# Patient Record
Sex: Female | Born: 1980
Health system: Southern US, Community
[De-identification: ages and names within clinical notes are randomized; demographics above are authoritative.]

## PROBLEM LIST (undated history)

## (undated) ENCOUNTER — Inpatient Hospital Stay (HOSPITAL_COMMUNITY): Payer: Self-pay

## (undated) DIAGNOSIS — E282 Polycystic ovarian syndrome: Secondary | ICD-10-CM

## (undated) DIAGNOSIS — I10 Essential (primary) hypertension: Secondary | ICD-10-CM

## (undated) DIAGNOSIS — D649 Anemia, unspecified: Secondary | ICD-10-CM

## (undated) HISTORY — DX: Polycystic ovarian syndrome: E28.2

## (undated) HISTORY — DX: Essential (primary) hypertension: I10

---

## 2005-02-04 ENCOUNTER — Other Ambulatory Visit: Admission: RE | Admit: 2005-02-04 | Discharge: 2005-02-04 | Payer: Self-pay | Admitting: Family Medicine

## 2005-02-04 ENCOUNTER — Ambulatory Visit: Payer: Self-pay | Admitting: Family Medicine

## 2006-08-04 ENCOUNTER — Other Ambulatory Visit: Admission: RE | Admit: 2006-08-04 | Discharge: 2006-08-04 | Payer: Self-pay | Admitting: Family Medicine

## 2006-08-04 ENCOUNTER — Ambulatory Visit: Payer: Self-pay | Admitting: Family Medicine

## 2006-08-04 LAB — CONVERTED CEMR LAB
AST: 24 units/L (ref 0–37)
Albumin: 4.3 g/dL (ref 3.5–5.2)
Basophils Absolute: 0 10*3/uL (ref 0.0–0.1)
CO2: 25 meq/L (ref 19–32)
Chloride: 107 meq/L (ref 96–112)
Chol/HDL Ratio, serum: 2.5
Cholesterol: 169 mg/dL (ref 0–200)
Creatinine, Ser: 0.9 mg/dL (ref 0.4–1.2)
Glomerular Filtration Rate, Af Am: 99 mL/min/{1.73_m2}
Glucose, Bld: 82 mg/dL (ref 70–99)
HCT: 36.5 % (ref 36.0–46.0)
Lymphocytes Relative: 29.2 % (ref 12.0–46.0)
MCHC: 32.9 g/dL (ref 30.0–36.0)
MCV: 84.8 fL (ref 78.0–100.0)
Neutro Abs: 2.8 10*3/uL (ref 1.4–7.7)
Neutrophils Relative %: 61.4 % (ref 43.0–77.0)
RBC: 4.31 M/uL (ref 3.87–5.11)
Sodium: 139 meq/L (ref 135–145)
Total Bilirubin: 0.8 mg/dL (ref 0.3–1.2)
Triglyceride fasting, serum: 36 mg/dL (ref 0–149)

## 2006-08-25 ENCOUNTER — Ambulatory Visit: Payer: Self-pay | Admitting: Family Medicine

## 2006-09-08 ENCOUNTER — Ambulatory Visit: Payer: Self-pay | Admitting: Family Medicine

## 2006-09-08 LAB — CONVERTED CEMR LAB
BUN: 17 mg/dL (ref 6–23)
CO2: 31 meq/L (ref 19–32)
Calcium: 10.4 mg/dL (ref 8.4–10.5)
Chloride: 103 meq/L (ref 96–112)
Creatinine, Ser: 1 mg/dL (ref 0.4–1.2)
Glucose, Bld: 69 mg/dL — ABNORMAL LOW (ref 70–99)

## 2006-12-01 ENCOUNTER — Ambulatory Visit: Payer: Self-pay | Admitting: Family Medicine

## 2006-12-15 ENCOUNTER — Ambulatory Visit: Payer: Self-pay | Admitting: Family Medicine

## 2007-03-15 ENCOUNTER — Ambulatory Visit: Payer: Self-pay | Admitting: Family Medicine

## 2007-03-15 DIAGNOSIS — I10 Essential (primary) hypertension: Secondary | ICD-10-CM | POA: Insufficient documentation

## 2007-03-16 LAB — CONVERTED CEMR LAB
Calcium: 9.6 mg/dL (ref 8.4–10.5)
Chloride: 108 meq/L (ref 96–112)
Creatinine, Ser: 0.8 mg/dL (ref 0.4–1.2)
GFR calc non Af Amer: 94 mL/min
Glucose, Bld: 82 mg/dL (ref 70–99)
Sodium: 141 meq/L (ref 135–145)

## 2007-09-14 ENCOUNTER — Ambulatory Visit: Payer: Self-pay | Admitting: Family Medicine

## 2007-09-17 LAB — CONVERTED CEMR LAB
CO2: 28 meq/L (ref 19–32)
Chloride: 107 meq/L (ref 96–112)
Creatinine, Ser: 0.8 mg/dL (ref 0.4–1.2)
Potassium: 3.6 meq/L (ref 3.5–5.1)
Sodium: 141 meq/L (ref 135–145)

## 2007-12-25 ENCOUNTER — Encounter: Payer: Self-pay | Admitting: Family Medicine

## 2008-01-02 LAB — CONVERTED CEMR LAB: Pap Smear: ABNORMAL

## 2008-05-14 ENCOUNTER — Ambulatory Visit: Payer: Self-pay | Admitting: Family Medicine

## 2008-05-14 DIAGNOSIS — E282 Polycystic ovarian syndrome: Secondary | ICD-10-CM | POA: Insufficient documentation

## 2008-05-25 LAB — CONVERTED CEMR LAB
ALT: 13 units/L (ref 0–35)
AST: 18 units/L (ref 0–37)
Alkaline Phosphatase: 58 units/L (ref 39–117)
Basophils Absolute: 0 10*3/uL (ref 0.0–0.1)
Basophils Relative: 0.2 % (ref 0.0–3.0)
Bilirubin, Direct: 0.1 mg/dL (ref 0.0–0.3)
CO2: 30 meq/L (ref 19–32)
Chloride: 111 meq/L (ref 96–112)
Cholesterol: 177 mg/dL (ref 0–200)
Eosinophils Absolute: 0.1 10*3/uL (ref 0.0–0.7)
GFR calc non Af Amer: 92 mL/min
HDL: 68.8 mg/dL (ref >39.0)
LDL Cholesterol: 97 mg/dL (ref 0–99)
Lymphocytes Relative: 31.6 % (ref 12.0–46.0)
MCHC: 32.9 g/dL (ref 30.0–36.0)
MCV: 85.4 fL (ref 78.0–100.0)
Neutrophils Relative %: 57.8 % (ref 43.0–77.0)
Platelets: 226 10*3/uL (ref 150–400)
Potassium: 3.7 meq/L (ref 3.5–5.1)
RBC: 4.31 M/uL (ref 3.87–5.11)
Total Bilirubin: 0.6 mg/dL (ref 0.3–1.2)
VLDL: 11 mg/dL (ref 0–40)
WBC: 4.2 10*3/uL — ABNORMAL LOW (ref 4.5–10.5)

## 2008-05-26 ENCOUNTER — Encounter (INDEPENDENT_AMBULATORY_CARE_PROVIDER_SITE_OTHER): Payer: Self-pay | Admitting: *Deleted

## 2008-08-13 ENCOUNTER — Ambulatory Visit (HOSPITAL_COMMUNITY): Admission: RE | Admit: 2008-08-13 | Discharge: 2008-08-13 | Payer: Self-pay | Admitting: Obstetrics and Gynecology

## 2008-08-22 HISTORY — PX: OTHER SURGICAL HISTORY: SHX169

## 2008-10-31 ENCOUNTER — Ambulatory Visit: Payer: Self-pay | Admitting: Family Medicine

## 2008-10-31 ENCOUNTER — Encounter (INDEPENDENT_AMBULATORY_CARE_PROVIDER_SITE_OTHER): Payer: Self-pay | Admitting: *Deleted

## 2008-10-31 DIAGNOSIS — J020 Streptococcal pharyngitis: Secondary | ICD-10-CM | POA: Insufficient documentation

## 2009-05-15 ENCOUNTER — Ambulatory Visit: Payer: Self-pay | Admitting: Family Medicine

## 2009-05-19 ENCOUNTER — Telehealth: Payer: Self-pay | Admitting: Family Medicine

## 2009-05-19 LAB — CONVERTED CEMR LAB
AST: 26 units/L (ref 0–37)
Alkaline Phosphatase: 45 units/L (ref 39–117)
Basophils Absolute: 0 10*3/uL (ref 0.0–0.1)
Basophils Relative: 0.1 % (ref 0.0–3.0)
Bilirubin, Direct: 0 mg/dL (ref 0.0–0.3)
CO2: 23 meq/L (ref 19–32)
Calcium: 9.7 mg/dL (ref 8.4–10.5)
Creatinine, Ser: 0.7 mg/dL (ref 0.4–1.2)
Eosinophils Absolute: 0.1 10*3/uL (ref 0.0–0.7)
GFR calc non Af Amer: 129.03 mL/min (ref >60)
HDL: 87.8 mg/dL (ref >39.00)
LDL Cholesterol: 95 mg/dL (ref 0–99)
Lymphocytes Relative: 14.8 % (ref 12.0–46.0)
MCHC: 32.6 g/dL (ref 30.0–36.0)
Monocytes Relative: 6.9 % (ref 3.0–12.0)
Neutrophils Relative %: 77.4 % — ABNORMAL HIGH (ref 43.0–77.0)
RBC: 3.65 M/uL — ABNORMAL LOW (ref 3.87–5.11)
Sodium: 136 meq/L (ref 135–145)
Total CHOL/HDL Ratio: 2
Total Protein: 7 g/dL (ref 6.0–8.3)
Triglycerides: 79 mg/dL (ref 0.0–149.0)
VLDL: 15.8 mg/dL (ref 0.0–40.0)

## 2009-09-23 ENCOUNTER — Inpatient Hospital Stay (HOSPITAL_COMMUNITY): Admission: AD | Admit: 2009-09-23 | Discharge: 2009-09-27 | Payer: Self-pay | Admitting: Obstetrics and Gynecology

## 2009-09-24 ENCOUNTER — Encounter (INDEPENDENT_AMBULATORY_CARE_PROVIDER_SITE_OTHER): Payer: Self-pay | Admitting: Obstetrics and Gynecology

## 2010-04-28 ENCOUNTER — Telehealth (INDEPENDENT_AMBULATORY_CARE_PROVIDER_SITE_OTHER): Payer: Self-pay | Admitting: *Deleted

## 2010-05-03 ENCOUNTER — Ambulatory Visit: Payer: Self-pay | Admitting: Family Medicine

## 2010-05-03 DIAGNOSIS — D649 Anemia, unspecified: Secondary | ICD-10-CM | POA: Insufficient documentation

## 2010-05-10 LAB — CONVERTED CEMR LAB
AST: 19 units/L (ref 0–37)
Albumin: 4.4 g/dL (ref 3.5–5.2)
Alkaline Phosphatase: 72 units/L (ref 39–117)
Basophils Relative: 0.4 % (ref 0.0–3.0)
Calcium: 9.6 mg/dL (ref 8.4–10.5)
Eosinophils Relative: 1.3 % (ref 0.0–5.0)
Folate: 8.8 ng/mL
GFR calc non Af Amer: 75.25 mL/min (ref >60)
Hemoglobin: 11.5 g/dL — ABNORMAL LOW (ref 12.0–15.0)
Iron: 63 ug/dL (ref 42–145)
Lymphocytes Relative: 41 % (ref 12.0–46.0)
Monocytes Relative: 7.7 % (ref 3.0–12.0)
Neutro Abs: 2.5 10*3/uL (ref 1.4–7.7)
RBC: 4.15 M/uL (ref 3.87–5.11)
Saturation Ratios: 14.5 % — ABNORMAL LOW (ref 20.0–50.0)
Sodium: 142 meq/L (ref 135–145)
Total Protein: 7.7 g/dL (ref 6.0–8.3)

## 2010-05-17 ENCOUNTER — Ambulatory Visit: Payer: Self-pay | Admitting: Family Medicine

## 2010-06-16 ENCOUNTER — Ambulatory Visit: Payer: Self-pay | Admitting: Family Medicine

## 2010-06-16 LAB — CONVERTED CEMR LAB
Basophils Absolute: 0 10*3/uL (ref 0.0–0.1)
Bilirubin, Direct: 0 mg/dL (ref 0.0–0.3)
Calcium: 9.5 mg/dL (ref 8.4–10.5)
Chloride: 107 meq/L (ref 96–112)
Creatinine, Ser: 0.8 mg/dL (ref 0.4–1.2)
Eosinophils Absolute: 0.1 10*3/uL (ref 0.0–0.7)
GFR calc non Af Amer: 108.16 mL/min (ref >60)
HDL: 57.6 mg/dL (ref >39.00)
Hemoglobin: 11.4 g/dL — ABNORMAL LOW (ref 12.0–15.0)
LDL Cholesterol: 120 mg/dL — ABNORMAL HIGH (ref 0–99)
Lymphocytes Relative: 33.1 % (ref 12.0–46.0)
MCHC: 33.5 g/dL (ref 30.0–36.0)
Neutro Abs: 2.2 10*3/uL (ref 1.4–7.7)
Neutrophils Relative %: 57.3 % (ref 43.0–77.0)
RDW: 13.2 % (ref 11.5–14.6)
Saturation Ratios: 11.9 % — ABNORMAL LOW (ref 20.0–50.0)
Total Bilirubin: 0.3 mg/dL (ref 0.3–1.2)
Total CHOL/HDL Ratio: 3
Triglycerides: 58 mg/dL (ref 0.0–149.0)
VLDL: 11.6 mg/dL (ref 0.0–40.0)

## 2010-07-14 ENCOUNTER — Telehealth (INDEPENDENT_AMBULATORY_CARE_PROVIDER_SITE_OTHER): Payer: Self-pay | Admitting: *Deleted

## 2010-08-10 ENCOUNTER — Ambulatory Visit: Payer: Self-pay | Admitting: Family Medicine

## 2010-09-21 NOTE — Progress Notes (Signed)
Summary: REFILL REQUEST 9/8  Phone Note Refill Request Message from:  Patient on April 28, 2010 9:15 AM  Refills Requested: Medication #1:  METHYLDOPA 250 MG  TABS 1 by mouth two times a day   Dosage confirmed as above?Dosage Confirmed   Supply Requested: 1 month [facility] [address]  Next Appointment Scheduled: NONE Initial call taken by: Lavell Islam,  April 28, 2010 9:16 AM  Follow-up for Phone Call        Please advise. Lucious Groves CMA  April 28, 2010 10:37 AM   Last OV 05/15/09 and last filled 05/14/08. Please advise Almeta Monas CMA Duncan Dull)  April 28, 2010 2:30 PM   Additional Follow-up for Phone Call Additional follow up Details #1::        pt needs ov Additional Follow-up by: Loreen Freud DO,  April 28, 2010 5:27 PM    Additional Follow-up for Phone Call Additional follow up Details #2::    TC to pt Left message to call back.   Almeta Monas CMA Duncan Dull)  April 29, 2010 12:05 PM

## 2010-09-21 NOTE — Progress Notes (Signed)
Summary: Refill Request  Phone Note Refill Request Call back at [phone] Message from:  Pharmacy on July 14, 2010 8:21 AM  Refills Requested: Medication #1:  METHYLDOPA 500 MG TABS 1 by mouth two times a day   Dosage confirmed as above?Dosage Confirmed   Supply Requested: 3 months   Last Refilled: 05/03/2010 [facility] Patient  is requesting 90 day supply prescriptions to be sent via fax to her mail order pharmacy.   Initial call taken by: Harold Barban,  July 14, 2010 8:22 AM    Prescriptions: METHYLDOPA 500 MG TABS (METHYLDOPA) 1 by mouth two times a day  #180 x 0   Entered by:   Doristine Devoid CMA   Authorized by:   Loreen Freud DO   Signed by:   Doristine Devoid CMA on 07/14/2010   Method used:   Electronically to        [facility]* (retail)       [address]       [place], [place]  [address]       Ph: [phone]       Fax: [phone]   RxID:   [id number]

## 2010-09-21 NOTE — Assessment & Plan Note (Signed)
Summary: o/v per phone note for med refill///sph   Vital Signs:  Patient profile:   30 year old female Menstrual status:  regular Weight:      208.8 pounds BMI:     33.82 Temp:     98.7 degrees F oral Pulse rate:   68 / minute Pulse rhythm:   regular BP sitting:   146 / 88  (left arm)  Vitals Entered By: Almeta Monas CMA Duncan Dull) (May 03, 2010 3:38 PM) CC: Needs Methyldopa andwants to discuss BP   History of Present Illness:  Hypertension follow-up      This is a 30 year old woman who presents for Hypertension follow-up.  The patient complains of headaches, but denies lightheadedness, urinary frequency, edema, impotence, rash, and fatigue.  The patient denies the following associated symptoms: chest pain, chest pressure, exercise intolerance, dyspnea, palpitations, syncope, leg edema, and pedal edema.  Compliance with medications (by patient report) has been sporadic.  The patient reports that dietary compliance has been good.  The patient reports no exercise.  Adjunctive measures currently used by the patient include salt restriction.    Preventive Screening-Counseling & Management  Alcohol-Tobacco     Alcohol drinks/day: 0     Smoking Status: never  Caffeine-Diet-Exercise     Caffeine use/day: 0     Does Patient Exercise: yes     Type of exercise: treadmill     Times/week: 3  Current Medications (verified): 1)  Methyldopa 500 Mg Tabs (Methyldopa) .[name].. 1 By Mouth Two Times A Day 2)  Th Prenatal Vitamins 28-0.8 Mg Tabs (Prenatal Vit-Fe Fumarate-Fa) .[name].. 1 By Mouth Once Daily  Allergies (verified): No Known Drug Allergies  Past History:  Past medical, surgical, family and social histories (including risk factors) reviewed for relevance to current acute and chronic problems.  Past Medical History: Reviewed history from 05/14/2008 and no changes required. Hypertension Current Problems:  POLYCYSTIC OVARIAN DISEASE (ICD-256.4) HYPERTENSION (ICD-401.9)  Past  Surgical History: Reviewed history from 05/14/2008 and no changes required. Denies surgical history  Family History: Reviewed history from 05/14/2008 and no changes required. none  Social History: Reviewed history from 05/14/2008 and no changes required. 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 Lungs are clear to auscultation, no crackles or wheezes. Heart:  normal rate and no murmur.   Extremities:  No clubbing, cyanosis, edema, or deformity noted with normal full range of motion of all joints.   Psych:  Oriented X3 and normally interactive.     Impression & Recommendations:  Problem # 1:  HYPERTENSION (ICD-401.9)  Her updated medication list for this problem includes:    Methyldopa 500 Mg Tabs (Methyldopa) .[name]... 1 by mouth two times a day  Orders: Venipuncture (38756) TLB-B12 + Folate Pnl (43329_51884-Z66/AYT) TLB-IBC Pnl (Iron/FE;Transferrin) (83550-IBC) TLB-BMP (Basic Metabolic Panel-BMET) (80048-METABOL) TLB-CBC Platelet - w/Differential (85025-CBCD) TLB-Hepatic/Liver Function Pnl (80076-HEPATIC) TLB-Ferritin (82728-FER) Specimen Handling (01601)  BP today: 146/88 Prior BP: 122/84 (05/15/2009)  Labs Reviewed: K+: 3.6 (05/15/2009) Creat: : 0.7 (05/15/2009)   Chol: 199 (05/15/2009)   HDL: 87.80 (05/15/2009)   LDL: 95 (05/15/2009)   TG: 79.0 (05/15/2009)  Complete Medication List: 1)  Methyldopa 500 Mg Tabs (Methyldopa) .[name].. 1 by mouth two times a day 2)  Th Prenatal Vitamins  28-0.8 Mg Tabs (Prenatal vit-fe fumarate-fa) .[name].. 1 by mouth once daily  Patient Instructions: 1)  Please schedule a follow-up appointment in 2 weeks.   Prescriptions: METHYLDOPA 500 MG TABS (METHYLDOPA) 1 by mouth two times a day  #60 x 2   Entered and Authorized by:   Loreen Freud DO   Signed by:   Loreen Freud DO on 05/03/2010   Method used:   Electronically to        [facility] # [id number]* (retail)       [address]       [place], [place]  [address]       Ph: [phone]       Fax: [phone]   RxID:   [id number]

## 2010-09-21 NOTE — Assessment & Plan Note (Signed)
Summary: rto 2 weeks.cbs   Vital Signs:  Patient profile:   30 year old female Menstrual status:  regular Weight:      212 pounds Pulse rate:   68 / minute Pulse rhythm:   regular BP sitting:   118 / 80  (right arm) Cuff size:   regular  Vitals Entered By: Almeta Monas CMA Duncan Dull) (May 17, 2010 3:14 PM) CC: f/u BP meds    History of Present Illness:  Hypertension follow-up      This is a 30 year old woman who presents for Hypertension follow-up.  The patient denies lightheadedness, urinary frequency, headaches, edema, impotence, rash, and fatigue.  The patient denies the following associated symptoms: chest pain, chest pressure, exercise intolerance, dyspnea, palpitations, syncope, leg edema, and pedal edema.  Compliance with medications (by patient report) has been near 100%.  The patient reports that dietary compliance has been good.  Adjunctive measures currently used by the patient include salt restriction.    Current Medications (verified): 1)  Methyldopa 500 Mg Tabs (Methyldopa) .[name].. 1 By Mouth Two Times A Day 2)  Th Prenatal Vitamins 28-0.8 Mg Tabs (Prenatal Vit-Fe Fumarate-Fa) .[name].. 1 By Mouth Once Daily  Allergies (verified): No Known Drug Allergies  Past History:  Past medical, surgical, family and social histories (including risk factors) reviewed for relevance to current acute and chronic problems.  Past Medical History: Reviewed history from 05/14/2008 and no changes required. Hypertension Current Problems:  POLYCYSTIC OVARIAN DISEASE (ICD-256.4) HYPERTENSION (ICD-401.9)  Past Surgical History: Reviewed history from 05/14/2008 and no changes required. Denies surgical history  Family History: Reviewed history from 05/14/2008 and no changes required. none  Social History: Reviewed history from 05/14/2008 and no changes required. 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 Lungs are clear to auscultation, no crackles or wheezes. Heart:  Normal rate and regular rhythm. S1 and S2 normal without gallop, murmur, click, rub or other extra sounds. Extremities:  No clubbing, cyanosis, edema, or deformity noted with normal full range of motion of all joints.   Psych:  Cognition and judgment appear intact. Alert and cooperative with normal attention span and concentration. No apparent delusions, illusions, hallucinations   Impression & Recommendations:  Problem # 1:  HYPERTENSION (ICD-401.9)  Her updated medication list for this problem includes:    Methyldopa 500 Mg Tabs (Methyldopa) .[name]... 1 by mouth two times a day  BP today: 118/80 Prior BP: 146/88 (05/03/2010)  Labs Reviewed: K+: 3.9 (05/03/2010) Creat: : 1.1 (05/03/2010)   Chol: 199 (05/15/2009)   HDL: 87.80 (05/15/2009)   LDL: 95 (05/15/2009)   TG: 79.0 (05/15/2009)  Complete Medication List: 1)  Methyldopa 500 Mg Tabs (Methyldopa) .[name].. 1 by mouth two times a day 2)  Th Prenatal Vitamins 28-0.8 Mg Tabs (Prenatal vit-fe fumarate-fa) .[name].. 1 by mouth once daily  Other Orders: Tdap => 65yrs IM ([address]) Admin 1st Vaccine (60454)  Patient Instructions: 1)  labs in Oct 285.9  cbcd, b12, ibc, ferritin,  401.9  lipid, bmp, hep 2)  Please schedule a follow-up appointment in 3 months .    Immunizations Administered:  Tetanus Vaccine:    Vaccine Type: Tdap    Site: left deltoid    Mfr: Merck    Dose: 0.5  ml    Route: IM    Given by: Almeta Monas CMA (AAMA)    Exp. Date: 06/10/2012    Lot #: [id number]    VIS given: 07/09/08 version given May 17, 2010.   Immunizations Administered:  Tetanus Vaccine:    Vaccine Type: Tdap    Site: left deltoid     Mfr: Merck    Dose: 0.5 ml    Route: IM    Given by: Almeta Monas CMA (AAMA)    Exp. Date: 06/10/2012    Lot #: [id number]    VIS given: 07/09/08 version given May 17, 2010.

## 2010-09-23 NOTE — Assessment & Plan Note (Signed)
Summary: rto 3 months/cbs   Vital Signs:  Patient profile:   30 year old female Menstrual status:  regular Weight:      203.6 pounds Pulse rate:   64 / minute Pulse rhythm:   regular BP sitting:   110 / 64  (left arm) Cuff size:   large  Vitals Entered By: Almeta Monas CMA Duncan Dull) (August 10, 2010 3:25 PM) CC: 3 mo f/u on BP   History of Present Illness:  Hypertension follow-up      This is a 30 year old woman who presents for Hypertension follow-up.  The patient denies lightheadedness, urinary frequency, headaches, edema, impotence, rash, and fatigue.  The patient denies the following associated symptoms: chest pain, chest pressure, exercise intolerance, dyspnea, palpitations, syncope, leg edema, and pedal edema.  Compliance with medications (by patient report) has been near 100%.  The patient reports that dietary compliance has been good.  The patient reports no exercise.  Adjunctive measures currently used by the patient include salt restriction.  Pt is not pregnant yet but is still trying.    Problems Prior to Update: 1)  Anemia  (ICD-285.9) 2)  Pregnant State, Incidental  (ICD-V22.2) 3)  Streptococcal Sore Throat  (ICD-034.0) 4)  Preventive Health Care  (ICD-V70.0) 5)  Polycystic Ovarian Disease  (ICD-256.4) 6)  Hypertension  (ICD-401.9)  Medications Prior to Update: 1)  Methyldopa 500 Mg Tabs (Methyldopa) .[name].. 1 By Mouth Two Times A Day 2)  Th Prenatal Vitamins 28-0.8 Mg Tabs (Prenatal Vit-Fe Fumarate-Fa) .[name].. 1 By Mouth Once Daily  Current Medications (verified): 1)  Methyldopa 500 Mg Tabs (Methyldopa) .[name].. 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 none  Social History: Reviewed history from 05/14/2008 and no changes required. 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 Lungs:  Normal respiratory effort, chest expands symmetrically. Lungs are clear to auscultation, no crackles or wheezes. Heart:  normal rate and no murmur.   Extremities:  No clubbing, cyanosis, edema, or deformity noted with normal full range of motion of all joints.   Psych:  Cognition and judgment appear intact. Alert and cooperative with normal attention span and concentration. No apparent delusions, illusions, hallucinations   Impression & Recommendations:  Problem # 1:  HYPERTENSION (ICD-401.9)  Her updated medication list for this problem includes:    Methyldopa 500 Mg Tabs (Methyldopa) .[name]... 1 by mouth two times a day  BP today: 110/64 Prior BP: 118/80 (05/17/2010)  Labs Reviewed: K+: 4.3 (06/16/2010) Creat: : 0.8 (06/16/2010)   Chol: 189 (06/16/2010)   HDL: 57.60 (06/16/2010)   LDL: 120 (06/16/2010)   TG: 58.0 (06/16/2010)  Complete Medication List: 1)  Methyldopa 500 Mg Tabs (Methyldopa) .Marland KitchenMarland KitchenMarland Kitchen  1 by mouth two times a day  Patient Instructions: 1)  Please schedule a follow-up appointment in 6 months .    Orders Added: 1)  Est.  Patient Level III [[address]]    Flu Vaccine Result Date:  06/16/2010 Flu Vaccine Result:  given Flu Vaccine Next Due:  1 yr

## 2010-11-11 LAB — CBC
HCT: 31.1 % — ABNORMAL LOW (ref 36.0–46.0)
HCT: 34 % — ABNORMAL LOW (ref 36.0–46.0)
Hemoglobin: 10.1 g/dL — ABNORMAL LOW (ref 12.0–15.0)
Hemoglobin: 11 g/dL — ABNORMAL LOW (ref 12.0–15.0)
MCHC: 32.4 g/dL (ref 30.0–36.0)
MCV: 89.7 fL (ref 78.0–100.0)
MCV: 90.3 fL (ref 78.0–100.0)
Platelets: 145 10*3/uL — ABNORMAL LOW (ref 150–400)
RBC: 3.79 MIL/uL — ABNORMAL LOW (ref 3.87–5.11)
RDW: 12.8 % (ref 11.5–15.5)

## 2010-12-27 ENCOUNTER — Other Ambulatory Visit: Payer: Self-pay | Admitting: *Deleted

## 2010-12-27 MED ORDER — METHYLDOPA 500 MG PO TABS: 500.0000 mg | ORAL_TABLET | Freq: Two times a day (BID) | ORAL | Status: DC

## 2010-12-27 NOTE — Telephone Encounter (Signed)
Way overdue for ov

## 2010-12-27 NOTE — Telephone Encounter (Signed)
Left message to call office. Per dr Laury Axon med only Rx during pregnancy and Pt has not been seen since 08-10-10. Pt need OV.

## 2010-12-27 NOTE — Telephone Encounter (Signed)
Left Pt detail message Rx sent to pharmacy and to call office to schedule OV.

## 2010-12-27 NOTE — Telephone Encounter (Signed)
Last filled 07-14-10 #180. Pt indicates that she is on this med due to trying to get pregnant again. Pt notes that this was discuss at last OV. pls advise on refills

## 2011-02-07 ENCOUNTER — Encounter: Payer: Self-pay | Admitting: Family Medicine

## 2011-02-08 ENCOUNTER — Encounter: Payer: Self-pay | Admitting: Family Medicine

## 2011-02-08 ENCOUNTER — Ambulatory Visit (INDEPENDENT_AMBULATORY_CARE_PROVIDER_SITE_OTHER): Payer: Commercial Indemnity | Admitting: Family Medicine

## 2011-02-08 VITALS — BP 111/67 | HR 80 | Ht 65.5 in | Wt 204.0 lb

## 2011-02-08 DIAGNOSIS — M5430 Sciatica, unspecified side: Secondary | ICD-10-CM

## 2011-02-08 DIAGNOSIS — R0789 Other chest pain: Secondary | ICD-10-CM

## 2011-02-08 NOTE — Progress Notes (Signed)
Subjective:    Patient ID: Katherine [name], female    DOB: [birth year], 30 y.o.   MRN: [id number]  HPI Had a baby about 16 months.She is trying to get pregnant again so staying on aldomet bid.  HTN before pregnancy.   Occ chest pain - Started a few weeks ago but worse the last 2 weeks. Felt it was a pulled muscle in her chest but hasn't resolved.  On the left side . Cna happen at rest.  No pressure. Occ feels sharp and occ sore.  Now has eased off some and now getting once a week. Pat uncle with MI in his 40s.  Father with Type II DM.  Was having a lot of heartburn around that time.  That is better as well.    Had sciatica when was pregnant, on the left side. Started again a couple of weeks ago. It is better too.  Took some motrin a couple of times.  Only bothered her once in the last week.   Review of Systems  Constitutional: Negative for fever, diaphoresis and unexpected weight change.  HENT: Negative for hearing loss, rhinorrhea and tinnitus.   Eyes: Negative for visual disturbance.  Respiratory: Negative for cough and wheezing.   Cardiovascular: Negative for chest pain and palpitations.  Gastrointestinal: Positive for anal bleeding. Negative for nausea, vomiting and diarrhea.  Genitourinary: Positive for vaginal bleeding. Negative for vaginal discharge and difficulty urinating.  Musculoskeletal: Negative for myalgias and arthralgias.  Skin: Negative for rash.  Neurological: Negative for headaches.  Hematological: Negative for adenopathy. Does not bruise/bleed easily.  Psychiatric/Behavioral: Negative for sleep disturbance and dysphoric mood. The patient is not nervous/anxious.      BP 111/67  Pulse 80  Ht 5' 5.5" (1.664 m)  Wt 204 lb (92.534 kg)  BMI 33.43 kg/m2    No Known Allergies  Past Medical History  Diagnosis Date  . Hypertension   . Polycystic ovarian disease     Past Surgical History  Procedure Date  . Hysterosalpingogram 2010  . Cesarean section February 2011     History   Social History  . Marital Status: Married    Spouse Name: Katherine [name]    Number of Children: 1  . Years of Education: N/A   Occupational History  . RN     [employer]   Social History Main Topics  . Smoking status: Never Smoker   . Smokeless tobacco: Not on file  . Alcohol Use: No  . Drug Use: No  . Sexually Active: Yes -- Female partner(s)     [employer] OR, married, regularly exercises.   Other Topics Concern  . Not on file   Social History Narrative   1 caffeine drink per day. No regular exercise.    Family History  Problem Relation Age of Onset  . Diabetes Father   . Alcohol abuse Maternal Uncle   . Diabetes Maternal Uncle   . Alcohol abuse Maternal Grandfather     Current outpatient prescriptions:methyldopa (ALDOMET) 500 MG tablet, Take 1 tablet (500 mg total) by mouth 2 (two) times daily., Disp: 60 tablet, Rfl: 11     Objective:   Physical Exam  Constitutional: She is oriented to person, place, and time. She appears well-developed and well-nourished.  HENT:  Head: Normocephalic and atraumatic.  Right Ear: External ear normal.  Left Ear: External ear normal.  Nose: Nose normal.  Mouth/Throat: Oropharynx is clear and moist.  Eyes: Conjunctivae and EOM are normal. Pupils are equal, round, and  reactive to light.  Neck: Neck supple. No thyromegaly present.  Cardiovascular: Normal rate, regular rhythm and normal heart sounds.        No carotid bruits or abdominal bruits.  Pulmonary/Chest: Effort normal and breath sounds normal.  Musculoskeletal: Normal range of motion.       Normal flexion and extension Nontender lumbar spine. Nontender of the SI joints appear and nontender over the buttock bilaterally. Hip knee and ankle strength is 5 out of 5. Negative straight leg raise. Patellar tendon 2+ bilaterally.  Lymphadenopathy:    She has no cervical adenopathy.  Neurological: She is alert and oriented to person, place, and time.  Skin: Skin is  warm and dry.  Psychiatric: She has a normal mood and affect.          Assessment & Plan:  Sciatica - Possible piriformis Syndrome- Given H.O to start some exercises. She has actually improved on her own but can work on this in the meantime.  She can certainly use an anti-inflammatory as needed. If her pain persists we can consider getting an x-ray of her lumbar spine.  Atypical Chest Pain - I explained her that I think it very unlikely that this is her heart as the pain typically occurs at rest and particularly when she's thinking about stressful things. I did perform an EKG today. EKG show rate of 61 bpm, NSR. This is reassuring. Consider also that she may have GERD gastritis. Since her symptoms are improving on their own we will just continue to follow it and if her pain becomes more frequent or does not resolve in the next month and please let me know. About to have her come back for complete physical. I did go ahead and give her a lab slip today so we can at least check a complete metabolic panel as well as lipid to further evaluate her possible cardiac risk. She can go the lab any time fasting.

## 2011-02-08 NOTE — Patient Instructions (Signed)
Please schedule your physical anytime.  You can go to the lab anytime between 8Am and 5PM Monday through Friday.

## 2011-02-14 ENCOUNTER — Ambulatory Visit: Payer: Self-pay | Admitting: Family Medicine

## 2011-02-25 ENCOUNTER — Encounter: Payer: Self-pay | Admitting: Family Medicine

## 2011-03-02 ENCOUNTER — Ambulatory Visit (INDEPENDENT_AMBULATORY_CARE_PROVIDER_SITE_OTHER): Payer: Commercial Indemnity | Admitting: Family Medicine

## 2011-03-02 ENCOUNTER — Other Ambulatory Visit (HOSPITAL_COMMUNITY)
Admission: RE | Admit: 2011-03-02 | Discharge: 2011-03-02 | Disposition: A | Discharge status: Home / Self Care | Payer: Commercial Indemnity | Source: Ambulatory Visit | Attending: Family Medicine | Admitting: Family Medicine

## 2011-03-02 ENCOUNTER — Encounter: Payer: Self-pay | Admitting: Family Medicine

## 2011-03-02 VITALS — BP 103/71 | HR 59 | Resp 20 | Ht 66.0 in | Wt 203.0 lb

## 2011-03-02 DIAGNOSIS — Z01419 Encounter for gynecological examination (general) (routine) without abnormal findings: Secondary | ICD-10-CM

## 2011-03-02 DIAGNOSIS — Z124 Encounter for screening for malignant neoplasm of cervix: Secondary | ICD-10-CM

## 2011-03-02 NOTE — Patient Instructions (Signed)
Start a regular exercise program and make sure you are eating a healthy diet Try to eat 4 servings of dairy a day or take a calcium supplement (500mg  twice a day). Your vaccines are up to date.  Follow up in 6 months for blood pressure

## 2011-03-02 NOTE — Progress Notes (Signed)
  Subjective:     Katherine [name] is a 30 y.o. female and is here for a comprehensive physical exam. The patient reports problems - mid cycle bleeding. They are schedule an [place] in August for possible polyp. [name]  History   Social History  . Marital Status: Married    Spouse Name: [name]    Number of Children: 1  . Years of Education: N/A   Occupational History  . RN     [employer]   Social History Main Topics  . Smoking status: Never Smoker   . Smokeless tobacco: Not on file  . Alcohol Use: No  . Drug Use: No  . Sexually Active: Yes -- Female partner(s)     [employer] OR, married, regularly exercises.   Other Topics Concern  . Not on file   Social History Narrative   1 caffeine drink per day. No regular exercise.   Health Maintenance  Topic Date Due  . Pap Smear  04/23/2000  . Tetanus/tdap  05/17/2020    The following portions of the patient's history were reviewed and updated as appropriate: allergies, current medications, past family history, past medical history, past social history and past surgical history.  Review of Systems A comprehensive review of systems was negative.   Objective:    BP 103/71  Pulse 59  Resp 20  Ht 5\' 6"  (1.676 m)  Wt 203 lb (92.08 kg)  BMI 32.76 kg/m2  SpO2 100%  LMP 02/05/2011 General appearance: alert, cooperative and appears stated age Head: Normocephalic, without obvious abnormality, atraumatic Eyes: conjunctiva clear, PEERLA, EOMi.  Ears: normal TM's and external ear canals both ears Nose: Nares normal. Septum midline. Mucosa normal. No drainage or sinus tenderness. Throat: lips, mucosa, and tongue normal; teeth and gums normal Neck: no adenopathy, no carotid bruit, supple, symmetrical, trachea midline and thyroid not enlarged, symmetric, no tenderness/mass/nodules Back: symmetric, no curvature. ROM normal. No CVA tenderness. Lungs: clear to auscultation bilaterally Breasts: normal appearance, no masses or  tenderness Heart: regular rate and rhythm, S1, S2 normal, no murmur, click, rub or gallop Abdomen: soft, non-tender; bowel sounds normal; no masses,  no organomegaly Pelvic: cervix normal in appearance, external genitalia normal, no adnexal masses or tenderness, no cervical motion tenderness, rectovaginal septum normal, uterus normal size, shape, and consistency and vagina normal without discharge Extremities: extremities normal, atraumatic, no cyanosis or edema Pulses: 2+ and symmetric Skin: Skin color, texture, turgor normal. No rashes or lesions Lymph nodes: Cervical, supraclavicular, and axillary nodes normal. Neurologic: Grossly normal    Assessment:    Healthy female exam.   Plan:     See After Visit Summary for Counseling Recommendations  Start a regular exercise program and make sure you are eating a healthy diet Try to eat 4 servings of dairy a day or take a calcium supplement (500mg  twice a day). Your vaccines are up to date.  She went for labs earlier today.

## 2011-03-03 ENCOUNTER — Telehealth: Payer: Self-pay | Admitting: Family Medicine

## 2011-03-03 LAB — COMPLETE METABOLIC PANEL WITH GFR
ALT: 11 U/L (ref 0–35)
AST: 17 U/L (ref 0–37)
BUN: 12 mg/dL (ref 6–23)
Creat: 0.88 mg/dL (ref 0.50–1.10)
GFR, Est African American: >60 mL/min (ref >60)
Total Bilirubin: 0.3 mg/dL (ref 0.3–1.2)

## 2011-03-03 LAB — LIPID PANEL
Cholesterol: 155 mg/dL (ref 0–200)
HDL: 56 mg/dL (ref >39)
LDL Cholesterol: 89 mg/dL (ref 0–99)
Triglycerides: 52 mg/dL (ref <150)
VLDL: 10 mg/dL (ref 0–40)

## 2011-03-03 NOTE — Telephone Encounter (Addendum)
Cal pt: CMP, Cholessterol, and thyroid look great.  Call pt: Pap is normal. Repeat in 2 years.

## 2011-03-10 NOTE — Telephone Encounter (Signed)
Advised pt of results and rec. 

## 2011-08-03 LAB — OB RESULTS CONSOLE ABO/RH: RH Type: POSITIVE

## 2011-08-03 LAB — OB RESULTS CONSOLE RPR: RPR: NONREACTIVE

## 2011-09-01 ENCOUNTER — Ambulatory Visit: Payer: Commercial Indemnity | Admitting: Family Medicine

## 2011-10-10 ENCOUNTER — Other Ambulatory Visit: Payer: Self-pay | Admitting: Family Medicine

## 2011-10-10 MED ORDER — METHYLDOPA 500 MG PO TABS: 500.0000 mg | ORAL_TABLET | Freq: Two times a day (BID) | ORAL | Status: DC

## 2011-10-10 NOTE — Telephone Encounter (Signed)
Patient is seeing Dr. Linford Arnold will forward the Refill request.     KP

## 2012-01-06 ENCOUNTER — Encounter (HOSPITAL_COMMUNITY): Payer: Self-pay | Admitting: *Deleted

## 2012-01-06 ENCOUNTER — Inpatient Hospital Stay (HOSPITAL_COMMUNITY)
Admission: AD | Admit: 2012-01-06 | Discharge: 2012-01-07 | DRG: 781 | Disposition: A | Discharge status: Home / Self Care | Payer: Managed Care, Other (non HMO) | Source: Ambulatory Visit | Attending: Obstetrics and Gynecology | Admitting: Obstetrics and Gynecology

## 2012-01-06 ENCOUNTER — Inpatient Hospital Stay (HOSPITAL_COMMUNITY): Payer: Managed Care, Other (non HMO)

## 2012-01-06 DIAGNOSIS — O34219 Maternal care for unspecified type scar from previous cesarean delivery: Secondary | ICD-10-CM | POA: Diagnosis present

## 2012-01-06 DIAGNOSIS — O368390 Maternal care for abnormalities of the fetal heart rate or rhythm, unspecified trimester, not applicable or unspecified: Principal | ICD-10-CM | POA: Diagnosis present

## 2012-01-06 DIAGNOSIS — O10019 Pre-existing essential hypertension complicating pregnancy, unspecified trimester: Secondary | ICD-10-CM | POA: Diagnosis present

## 2012-01-06 HISTORY — DX: Polycystic ovarian syndrome: E28.2

## 2012-01-06 LAB — DIFFERENTIAL
Basophils Relative: 0 % (ref 0–1)
Eosinophils Absolute: 0.1 10*3/uL (ref 0.0–0.7)
Eosinophils Relative: 1 % (ref 0–5)
Lymphs Abs: 1.1 10*3/uL (ref 0.7–4.0)
Monocytes Relative: 7 % (ref 3–12)
Neutrophils Relative %: 72 % (ref 43–77)

## 2012-01-06 LAB — COMPREHENSIVE METABOLIC PANEL
Albumin: 3.2 g/dL — ABNORMAL LOW (ref 3.5–5.2)
BUN: 5 mg/dL — ABNORMAL LOW (ref 6–23)
Calcium: 9 mg/dL (ref 8.4–10.5)
Creatinine, Ser: 0.6 mg/dL (ref 0.50–1.10)
GFR calc Af Amer: >90 mL/min (ref >90)
Glucose, Bld: 72 mg/dL (ref 70–99)
Potassium: 4.3 mEq/L (ref 3.5–5.1)
Total Protein: 6.5 g/dL (ref 6.0–8.3)

## 2012-01-06 LAB — URIC ACID: Uric Acid, Serum: 4.9 mg/dL (ref 2.4–7.0)

## 2012-01-06 LAB — URINALYSIS, ROUTINE W REFLEX MICROSCOPIC
Hgb urine dipstick: NEGATIVE
Nitrite: NEGATIVE
Protein, ur: NEGATIVE mg/dL
Specific Gravity, Urine: <1.005 — ABNORMAL LOW (ref 1.005–1.030)
Urobilinogen, UA: 0.2 mg/dL (ref 0.0–1.0)

## 2012-01-06 LAB — CBC
Hemoglobin: 10.2 g/dL — ABNORMAL LOW (ref 12.0–15.0)
MCH: 27.9 pg (ref 26.0–34.0)
MCHC: 32.4 g/dL (ref 30.0–36.0)
MCV: 86.3 fL (ref 78.0–100.0)

## 2012-01-06 LAB — URINE MICROSCOPIC-ADD ON

## 2012-01-06 LAB — LACTATE DEHYDROGENASE: LDH: 278 U/L — ABNORMAL HIGH (ref 94–250)

## 2012-01-06 MED ORDER — LACTATED RINGERS IV SOLN
INTRAVENOUS | Status: DC
  Administered 2012-01-06: 13:00:00 via INTRAVENOUS

## 2012-01-06 MED ORDER — BETAMETHASONE SOD PHOS & ACET 6 (3-3) MG/ML IJ SUSP
12.0000 mg | INTRAMUSCULAR | Status: AC
  Administered 2012-01-06 – 2012-01-07 (×2): 12 mg via INTRAMUSCULAR
  Filled 2012-01-06 (×2): qty 2

## 2012-01-06 MED ORDER — DOCUSATE SODIUM 100 MG PO CAPS
100.0000 mg | ORAL_CAPSULE | Freq: Every day | ORAL | Status: DC
  Administered 2012-01-07: 100 mg via ORAL
  Filled 2012-01-06 (×3): qty 1

## 2012-01-06 MED ORDER — ACETAMINOPHEN 325 MG PO TABS: 650.0000 mg | ORAL_TABLET | ORAL | Status: DC | PRN

## 2012-01-06 MED ORDER — METHYLDOPA 500 MG PO TABS
500.0000 mg | ORAL_TABLET | Freq: Two times a day (BID) | ORAL | Status: DC
  Administered 2012-01-06 – 2012-01-07 (×2): 500 mg via ORAL
  Filled 2012-01-06 (×4): qty 1

## 2012-01-06 MED ORDER — CALCIUM CARBONATE ANTACID 500 MG PO CHEW
2.0000 | CHEWABLE_TABLET | ORAL | Status: DC | PRN
  Filled 2012-01-06: qty 2

## 2012-01-06 MED ORDER — PRENATAL MULTIVITAMIN CH
1.0000 | ORAL_TABLET | Freq: Every day | ORAL | Status: DC
Start: 2012-01-06 — End: 2012-01-07
  Filled 2012-01-06 (×2): qty 1

## 2012-01-06 MED ORDER — ZOLPIDEM TARTRATE 10 MG PO TABS: 10.0000 mg | ORAL_TABLET | Freq: Every evening | ORAL | Status: DC | PRN

## 2012-01-06 MED ORDER — LACTATED RINGERS IV SOLN
INTRAVENOUS | Status: DC
  Administered 2012-01-06: 16:00:00 via INTRAVENOUS

## 2012-01-06 MED ORDER — SODIUM CHLORIDE 0.9 % IJ SOLN: 3.0000 mL | Freq: Two times a day (BID) | INTRAMUSCULAR | Status: DC

## 2012-01-06 NOTE — MAU Provider Note (Signed)
Chief Complaint:  Non-stress Test   First Provider Initiated Contact with Patient 01/06/12 1324      HPI  Katherine [name] is a 31 y.o. G2P1001 at [redacted]w[redacted]d escorted to MAU for staff of [facility] for ?deceleration.   Denies contractions, leakage of fluid, vaginal bleeding, HA, vision changes or epigastric pain. Good fetal movement.   Pregnancy Course: CHTN  Past Medical History: Past Medical History  Diagnosis Date  . Hypertension   . Polycystic ovarian disease   . Polycystic ovarian syndrome     Past Surgical History: Past Surgical History  Procedure Date  . Hysterosalpingogram 2010  . Cesarean section February 2011    Family History: Family History  Problem Relation Age of Onset  . Diabetes Father   . Alcohol abuse Maternal Uncle   . Diabetes Maternal Uncle   . Alcohol abuse Maternal Grandfather     Social History: History  Substance Use Topics  . Smoking status: Never Smoker   . Smokeless tobacco: Not on file  . Alcohol Use: No    Allergies: No Known Allergies  Meds:  Prescriptions prior to admission  Medication Sig Dispense Refill  . methyldopa (ALDOMET) 500 MG tablet Take 1 tablet (500 mg total) by mouth 2 (two) times daily.  60 tablet  11      Physical Exam  Blood pressure 119/78, pulse 100, temperature 97.1 F (36.2 C), temperature source Oral, resp. rate 20, last menstrual period 02/05/2011. GENERAL: Well-developed, well-nourished female in no acute distress.  HEENT: normocephalic HEART: normal rate RESP: normal effort ABDOMEN: Soft, nontender, nondistended, gravid.  EXTREMITIES: Nontender, no edema, DTRs 1 +, no clonus NEURO: alert and oriented  SPECULUM EXAM: Deferred    FHT:  Baseline 120-130 , moderate variability, accelerations present, no decelerations. Audible arrythmia. Tracing intermittently halving FHR. Contractions: None   Labs: Results for orders placed during the hospital encounter of 01/06/12 (from the past 24 hour(s))   CBC     Status: Abnormal   Collection Time   01/06/12  1:08 PM      Component Value Range   WBC 5.9  4.0 - 10.5 (K/uL)   RBC 3.65 (*) 3.87 - 5.11 (MIL/uL)   Hemoglobin 10.2 (*) 12.0 - 15.0 (g/dL)   HCT 56.2 (*) 13.0 - 46.0 (%)   MCV 86.3  78.0 - 100.0 (fL)   MCH 27.9  26.0 - 34.0 (pg)   MCHC 32.4  30.0 - 36.0 (g/dL)   RDW 86.5  78.4 - 69.6 (%)   Platelets 173  150 - 400 (K/uL)  DIFFERENTIAL     Status: Normal   Collection Time   01/06/12  1:08 PM      Component Value Range   Neutrophils Relative 72  43 - 77 (%)   Neutro Abs 4.3  1.7 - 7.7 (K/uL)   Lymphocytes Relative 19  12 - 46 (%)   Lymphs Abs 1.1  0.7 - 4.0 (K/uL)   Monocytes Relative 7  3 - 12 (%)   Monocytes Absolute 0.4  0.1 - 1.0 (K/uL)   Eosinophils Relative 1  0 - 5 (%)   Eosinophils Absolute 0.1  0.0 - 0.7 (K/uL)   Basophils Relative 0  0 - 1 (%)   Basophils Absolute 0.0  0.0 - 0.1 (K/uL)  COMPREHENSIVE METABOLIC PANEL     Status: Abnormal   Collection Time   01/06/12  1:08 PM      Component Value Range   Sodium 136  135 -  145 (mEq/L)   Potassium 4.3  3.5 - 5.1 (mEq/L)   Chloride 103  96 - 112 (mEq/L)   CO2 21  19 - 32 (mEq/L)   Glucose, Bld 72  70 - 99 (mg/dL)   BUN 5 (*) 6 - 23 (mg/dL)   Creatinine, Ser 1.47  0.50 - 1.10 (mg/dL)   Calcium 9.0  8.4 - 82.9 (mg/dL)   Total Protein 6.5  6.0 - 8.3 (g/dL)   Albumin 3.2 (*) 3.5 - 5.2 (g/dL)   AST 25  0 - 37 (U/L)   ALT 11  0 - 35 (U/L)   Alkaline Phosphatase 72  39 - 117 (U/L)   Total Bilirubin 0.2 (*) 0.3 - 1.2 (mg/dL)   GFR calc non Af Amer >90  >90 (mL/min)   GFR calc Af Amer >90  >90 (mL/min)  LACTATE DEHYDROGENASE     Status: Abnormal   Collection Time   01/06/12  1:08 PM      Component Value Range   LDH 278 (*) 94 - 250 (U/L)  URIC ACID     Status: Normal   Collection Time   01/06/12  1:08 PM      Component Value Range   Uric Acid, Serum 4.9  2.4 - 7.0 (mg/dL)      Imaging:    Assessment: Fetal arrythmia, fatal status reassuring    Plan: Admit to Antenatal per consult w/ Dr. Marcelle Overlie.  MFM and NICU consults  Dorathy Kinsman 5/17/20131:56 PM

## 2012-01-06 NOTE — Progress Notes (Signed)
FHR baseline appears to be 130, intermittent tracing, audible dropped beats noted.

## 2012-01-06 NOTE — MAU Note (Signed)
Dr. Marcelle Overlie & Ivonne Andrew CNM @ bedside, IVF bolus going, O2 on @ 10L/min by mask, U/S on way to do BPP @ bedside.

## 2012-01-06 NOTE — Progress Notes (Signed)
Katherine [name]  DICTATION # [id number] CSN# [id number]   Meriel Pica, MD 01/06/2012 2:22 PM

## 2012-01-06 NOTE — Progress Notes (Signed)
EFM DC'd, BPP in progress.

## 2012-01-06 NOTE — MAU Note (Signed)
US @ bedside for BPP. 

## 2012-01-06 NOTE — MAU Note (Signed)
Bedside U/S complete, EFM reapplied.

## 2012-01-06 NOTE — MAU Note (Signed)
Pt escorted to MAU by [facility] staff, states pt had fetal bradycardia during NST.

## 2012-01-06 NOTE — H&P (Signed)
Katherine [name], [name] NO.:  [id number]  MEDICAL RECORD NO.:  [id number]  LOCATION:  9158                          FACILITY:  [facility]  PHYSICIAN:  Duke Salvia. Marcelle Overlie, M.D.DATE OF BIRTH:  [birth year]  DATE OF ADMISSION:  01/06/2012 DATE OF DISCHARGE:                             HISTORY & PHYSICAL   CHIEF COMPLAINT:  Fetal bradycardia versus fetal arrhythmia, 32 weeks.  HISTORY OF PRESENT ILLNESS:  The patient is a 31 year old G2, P1 at 32 weeks, previous cesarean section for scheduled repeat cesarean section. Pregnancy has been complicated by chronic hypertension, for which she takes Aldomet 500 mg p.o. b.i.d.  She was in the office today for routine exam and nonstress test, and was noted to either have fetal arrhythmia or some intermittent fetal bradycardia and was sent rapidly to MAU accompanied by our office nurses for further evaluation.  In MAU auscultation sounded like a fetal arrhythmia with skipped beats, with a baseline of a fairly normal.  An IV was started.  Blood work was drawn with a plan to do BPP, MFM consult for fetal arrhythmia and further monitoring.  PAST MEDICAL HISTORY:  Please see the [facility] form for details.  PHYSICAL EXAMINATION:  VITAL SIGNS:  Temp 98.2, blood pressure 130/78. HEENT:  Unremarkable. NECK:  Supple without masses. LUNGS:  Clear. CARDIOVASCULAR:  Regular rate and rhythm without murmurs, rubs, gallops. BREASTS:  Not examined. PELVIS:  32 cm fundal height.  Fetal heart rate 140, irregular to auscultation. The cervix was not examined, 1+ lower extremity edema. Reflexes are normal.  IMPRESSION: 1. 32-week gestation. 2. Previous cesarean section.  Schedule repeat cesarean section. 3. Intermittent fetal bradycardia versus fetal arrhythmia. 4. Chronic hypertension, on Aldomet.  PLAN:  In MAU, BPP was 6/8, too off for breathing, she will be admitted for continuous monitoring in MFM consult this afternoon. We will go ahead  and proceed with betamethasone series while she is here.     Katherine [name] M. Marcelle Overlie, M.D.     [facility]/MEDQ  D:  01/06/2012  T:  01/06/2012  Job:  [id number]

## 2012-01-07 NOTE — Progress Notes (Signed)
MFM Note (late entry)  Katherine [name] is a 31 year old G2P1 AA female at 32+ weeks who was admitted yesterday from the office for possible fetal arrhythmia with bradycardia. Her only prenatal complication has been mild chronic hypertension which has been controlled with Aldomet. She has had one prior pregnancy which was uncomplicated except for need for C/S for failure to descend.   Ultrasound on admission revealed a BPP of 6/8 and clips of the heart showed what appeared to be normal rhythm with frequent ectopic beats. Radiologist concluded that these were premature atrial contractions. On the monitor, the tracing was reactive with the fetal heart rate within normal limits. The tracing shows intermittent rates of 50s which was not accurate. Prior USs in the office have detected no cardiac anomalies.  Sinus rhythm with frequent ectopic beats was explained to Katherine [name] in detail. For the most part they are benign in that the fetus can tolerate the ectopic beats without difficulty. The extra beats may resolve spontaneously and then start again. If present at term, they most always resolve immediately after delivery. In only 1-2 % of cases, the rhythm may convert to a true tachyarrhythmia with rates between 220-240 b/m.  Assessment: 1) IUP at 32+2 weeks 2) Frequent ectopic beats - most likely PACs 3) Mild chronic hypertension  Suggest: Fetal ECHO to recheck heart anatomy and to further delineate type of ectopic beats; auscultate fetal heart rate once or twice weekly looking for rates > 200 b/m; please refer back if a tachyarrhythmia is discovered  Thank for allowing [place] to participate in the care of your patient.  (Face-to-face consultation with patient: 30 min)

## 2012-01-07 NOTE — Discharge Summary (Signed)
Physician Discharge Summary  Patient ID: Katherine [name] MRN: [id number] DOB/AGE: [birth year] 30 y.o.  Admit date: 01/06/2012 Discharge date: 01/07/2012  Admission Diagnoses:32 week IUP, chronic HTN, fetal arrhythmia  Discharge Diagnoses:  Active Problems:  * No active hospital problems. *  same  Discharged Condition: good  Hospital Course: admit emrgently for poss fetal bradycardian, rec'd BMZ series and MFM consult, obsv overnight for fetal arrhythmia  Consults: MFM  Significant Diagnostic Studies: BMZ  Treatments: IV hydration and steroids: BMZ  Discharge Exam: Blood pressure 111/61, pulse 91, temperature 98.5 F (36.9 C), temperature source Oral, resp. rate 18, height 5\' 6"  (1.676 m), weight 220 lb (99.791 kg), last menstrual period 02/05/2011. stable FHR baseline with irreg fetal beats  Disposition:   Discharge Orders    Future Orders Please Complete By Expires   OB RESULTS CONSOLE GC/Chlamydia      Comments:   This external order was created through the Results Console.   OB RESULTS CONSOLE RPR      Comments:   This external order was created through the Results Console.   OB RESULTS CONSOLE HIV antibody      Comments:   This external order was created through the Results Console.   OB RESULTS CONSOLE Rubella Antibody      Comments:   This external order was created through the Results Console.   OB RESULTS CONSOLE Hepatitis B surface antigen      Comments:   This external order was created through the Results Console.   OB RESULTS CONSOLE ABO/Rh      Comments:   This external order was created through the Results Console.   OB RESULTS CONSOLE Antibody Screen      Comments:   This external order was created through the Results Console.     Medication List  As of 01/07/2012  9:48 AM   TAKE these medications         ferrous sulfate 325 (65 FE) MG tablet   Take 325 mg by mouth daily.      methyldopa 500 MG tablet   Commonly known as: ALDOMET   Take 1 tablet  (500 mg total) by mouth 2 (two) times daily.      prenatal multivitamin Tabs   Take 1 tablet by mouth at bedtime.           Follow-up Information    Follow up with Meriel Pica, MD. (has ppt 1 week)    Contact information:   [address] [place] [address] [phone]          Signed: Meriel Pica 01/07/2012, 9:48 AM

## 2012-01-07 NOTE — OB Triage Note (Signed)
Discharged to home with all belongings. Pt in stable condition. Medication and discharge instructions given, pt able to verbalize understanding. Discharged by wheelchair to family car. Milford Cage RN

## 2012-01-07 NOTE — Progress Notes (Signed)
Patient ID: Katherine [name], female   DOB: [birth year], 31 y.o.   MRN: [id number] [redacted]w[redacted]d  Discussed pt with Dr Sherrie George last PM>>pt with fetal arrhythmia, no evidence of persistent fetal brady or tachy, she rec weekly visit to R/O tachyarrhythmia, and fetal cardiac ECHO with Dr Elizebeth Brooking, will be scheduled  BP 111/61  Pulse 91  Temp(Src) 98.5 F (36.9 C) (Oral)  Resp 18  Ht 5\' 6"  (1.676 m)  Wt 220 lb (99.791 kg)  BMI 35.51 kg/m2  LMP 02/05/2011 Will D/C after second BMZ

## 2012-01-09 NOTE — Progress Notes (Signed)
UR chart review completed.  

## 2012-02-03 LAB — OB RESULTS CONSOLE GBS: GBS: NEGATIVE

## 2012-02-12 ENCOUNTER — Encounter (HOSPITAL_COMMUNITY): Payer: Self-pay | Admitting: Pharmacist

## 2012-02-15 ENCOUNTER — Encounter (HOSPITAL_COMMUNITY): Payer: Self-pay | Admitting: Pharmacy Technician

## 2012-02-15 NOTE — H&P (Addendum)
31 yo G2P1 @ 39 wks presents for repeat c-section.  Pt has been followed closely for irregular fetal heart rate & CHTN. Fetal echo wnl   PMHx:  CHTN PSHx:  c-section FHX:  Mom - lupus, kidney dz All:  None Meds:  Aldomet, PNV  AF, VSS  BP 120/70 Gen - NAD CV RRR Lungs clear bilaterally Abd - gravid, NT Ext - mild bilateral edema PV - cvx 1cm  A/P:  Previous c-section, desires repeat.  Plan of care discussed again w/ pt.  Informed consent.  Questions answered Fetal arrythmia - plan for EKG after delivery by peds

## 2012-02-19 ENCOUNTER — Encounter (HOSPITAL_COMMUNITY): Payer: Self-pay | Admitting: *Deleted

## 2012-02-19 ENCOUNTER — Inpatient Hospital Stay (HOSPITAL_COMMUNITY)
Admission: AD | Admit: 2012-02-19 | Discharge: 2012-02-19 | Disposition: A | Discharge status: Home / Self Care | Payer: Managed Care, Other (non HMO) | Source: Ambulatory Visit | Attending: Obstetrics and Gynecology | Admitting: Obstetrics and Gynecology

## 2012-02-19 ENCOUNTER — Inpatient Hospital Stay (HOSPITAL_COMMUNITY): Payer: Managed Care, Other (non HMO)

## 2012-02-19 DIAGNOSIS — O99891 Other specified diseases and conditions complicating pregnancy: Secondary | ICD-10-CM | POA: Insufficient documentation

## 2012-02-19 DIAGNOSIS — O10019 Pre-existing essential hypertension complicating pregnancy, unspecified trimester: Secondary | ICD-10-CM | POA: Insufficient documentation

## 2012-02-19 NOTE — MAU Note (Signed)
Pt reports after she went to the BR she had some leaking none since . Nurse advised her to come in. Also reports some cramping as well. Reports good fetal moement

## 2012-02-19 NOTE — Progress Notes (Signed)
SSE by Dr. Arelia Sneddon noted, large amount white discharge visualized, negative nitrazine by MD.

## 2012-02-19 NOTE — MAU Provider Note (Signed)
  History   Patient presents at 38.4 with possible ROM.  For repeat cesarean section.  Pregnancy complicated by chronic hypertension.    CSN: [id number]  Arrival date and time: 02/19/12 1125   None     Chief Complaint  Patient presents with  . Rupture of Membranes   HPI  OB History    Grav Para Term Preterm Abortions TAB SAB Ect Mult Living   2 1 1       1       Past Medical History  Diagnosis Date  . Hypertension   . Polycystic ovarian disease   . Polycystic ovarian syndrome     Past Surgical History  Procedure Date  . Hysterosalpingogram 2010  . Cesarean section February 2011    Family History  Problem Relation Age of Onset  . Diabetes Father   . Alcohol abuse Maternal Uncle   . Diabetes Maternal Uncle   . Alcohol abuse Maternal Grandfather     History  Substance Use Topics  . Smoking status: Never Smoker   . Smokeless tobacco: Not on file  . Alcohol Use: No    Allergies: No Known Allergies  Prescriptions prior to admission  Medication Sig Dispense Refill  . ferrous sulfate 325 (65 FE) MG tablet Take 325 mg by mouth daily.      . methyldopa (ALDOMET) 500 MG tablet Take 1 tablet (500 mg total) by mouth 2 (two) times daily.  60 tablet  11  . Prenatal Vit-Fe Fumarate-FA (PRENATAL MULTIVITAMIN) TABS Take 1 tablet by mouth at bedtime.        ROS Physical Exam   Blood pressure 136/82, pulse 88, temperature 98.9 F (37.2 C), temperature source Oral, resp. rate 18, height 5\' 5"  (1.651 m), weight 101.334 kg (223 lb 6.4 oz), last menstrual period 02/05/2011.  Physical Exam  Sterile speculum exam:  Negative for pool.  Thick white discharge.  Negative Nitrazine.  Cervix long and closed with vtx floating.      MAU Course  Procedures   Assessment and Plan  IUP at 38.4. Membranes appear to be intact.  Chronic hypertension. Check sono  For bpp and afi. If normal d/c home jm .  Elliet Goodnow S 02/19/2012, 1:12 PM P

## 2012-02-19 NOTE — Discharge Instructions (Signed)

## 2012-02-20 ENCOUNTER — Encounter (HOSPITAL_COMMUNITY): Payer: Self-pay

## 2012-02-20 ENCOUNTER — Encounter (HOSPITAL_COMMUNITY)
Admission: RE | Admit: 2012-02-20 | Discharge: 2012-02-20 | Disposition: A | Discharge status: Home / Self Care | Payer: Managed Care, Other (non HMO) | Source: Ambulatory Visit | Attending: Obstetrics and Gynecology | Admitting: Obstetrics and Gynecology

## 2012-02-20 HISTORY — DX: Anemia, unspecified: D64.9

## 2012-02-20 LAB — TYPE AND SCREEN
ABO/RH(D): O POS
Antibody Screen: NEGATIVE

## 2012-02-20 LAB — CBC
Hemoglobin: 10.3 g/dL — ABNORMAL LOW (ref 12.0–15.0)
RBC: 3.64 MIL/uL — ABNORMAL LOW (ref 3.87–5.11)

## 2012-02-20 LAB — ABO/RH: ABO/RH(D): O POS

## 2012-02-20 LAB — SURGICAL PCR SCREEN: Staphylococcus aureus: NEGATIVE

## 2012-02-20 NOTE — Pre-Procedure Instructions (Signed)
Patient states her baby has been dx'd in utero with an arrhythmmia. Pediatric cardiologist is Dr. Elsie Lincoln.

## 2012-02-20 NOTE — Patient Instructions (Addendum)
YOUR PROCEDURE IS SCHEDULED ON:02/22/12  ENTER THROUGH THE MAIN ENTRANCE OF [facility] AT:1130am  USE DESK PHONE AND DIAL [phone] TO INFORM us OF YOUR ARRIVAL  CALL [phone] IF YOU HAVE ANY QUESTIONS OR PROBLEMS PRIOR TO YOUR ARRIVAL.  REMEMBER: DO NOT EAT AFTER MIDNIGHT : Tuesday   SPECIAL INSTRUCTIONS:clear liquids ok until 9am Wed   YOU MAY BRUSH YOUR TEETH THE MORNING OF SURGERY   TAKE THESE MEDICINES THE DAY OF SURGERY WITH SIP OF WATER: BP med   DO NOT WEAR JEWELRY, EYE MAKEUP, LIPSTICK OR DARK FINGERNAIL POLISH DO NOT WEAR LOTIONS  DO NOT SHAVE FOR 48 HOURS PRIOR TO SURGERY  YOU WILL NOT BE ALLOWED TO DRIVE YOURSELF HOME.

## 2012-02-21 LAB — RPR: RPR Ser Ql: NONREACTIVE

## 2012-02-21 MED ORDER — CEFAZOLIN SODIUM-DEXTROSE 2-3 GM-% IV SOLR
2.0000 g | INTRAVENOUS | Status: AC
  Administered 2012-02-22: 2 g via INTRAVENOUS

## 2012-02-22 ENCOUNTER — Inpatient Hospital Stay (HOSPITAL_COMMUNITY)
Admission: RE | Admit: 2012-02-22 | Discharge: 2012-02-24 | DRG: 765 | Disposition: A | Discharge status: Home / Self Care | Payer: Managed Care, Other (non HMO) | Source: Ambulatory Visit | Attending: Obstetrics and Gynecology | Admitting: Obstetrics and Gynecology

## 2012-02-22 ENCOUNTER — Encounter (HOSPITAL_COMMUNITY): Payer: Self-pay | Admitting: *Deleted

## 2012-02-22 ENCOUNTER — Encounter (HOSPITAL_COMMUNITY): Payer: Self-pay | Admitting: Anesthesiology

## 2012-02-22 ENCOUNTER — Encounter (HOSPITAL_COMMUNITY)
Admission: RE | Disposition: A | Discharge status: Home / Self Care | Payer: Self-pay | Source: Ambulatory Visit | Attending: Obstetrics and Gynecology

## 2012-02-22 ENCOUNTER — Encounter (HOSPITAL_COMMUNITY): Payer: Self-pay

## 2012-02-22 ENCOUNTER — Inpatient Hospital Stay (HOSPITAL_COMMUNITY): Payer: Managed Care, Other (non HMO)

## 2012-02-22 DIAGNOSIS — O34219 Maternal care for unspecified type scar from previous cesarean delivery: Principal | ICD-10-CM | POA: Diagnosis present

## 2012-02-22 DIAGNOSIS — I10 Essential (primary) hypertension: Secondary | ICD-10-CM

## 2012-02-22 DIAGNOSIS — O1002 Pre-existing essential hypertension complicating childbirth: Secondary | ICD-10-CM | POA: Diagnosis present

## 2012-02-22 DIAGNOSIS — D649 Anemia, unspecified: Secondary | ICD-10-CM

## 2012-02-22 DIAGNOSIS — Z01818 Encounter for other preprocedural examination: Secondary | ICD-10-CM

## 2012-02-22 DIAGNOSIS — O3660X0 Maternal care for excessive fetal growth, unspecified trimester, not applicable or unspecified: Secondary | ICD-10-CM | POA: Diagnosis present

## 2012-02-22 DIAGNOSIS — Z98891 History of uterine scar from previous surgery: Secondary | ICD-10-CM

## 2012-02-22 DIAGNOSIS — Z01812 Encounter for preprocedural laboratory examination: Secondary | ICD-10-CM

## 2012-02-22 DIAGNOSIS — E282 Polycystic ovarian syndrome: Secondary | ICD-10-CM

## 2012-02-22 SURGERY — Surgical Case
Anesthesia: Spinal | Wound class: Clean Contaminated

## 2012-02-22 MED ORDER — DIPHENHYDRAMINE HCL 25 MG PO CAPS: 25.0000 mg | ORAL_CAPSULE | Freq: Four times a day (QID) | ORAL | Status: DC | PRN

## 2012-02-22 MED ORDER — TETANUS-DIPHTH-ACELL PERTUSSIS 5-2.5-18.5 LF-MCG/0.5 IM SUSP: 0.5000 mL | Freq: Once | INTRAMUSCULAR | Status: DC

## 2012-02-22 MED ORDER — LACTATED RINGERS IV SOLN
INTRAVENOUS | Status: DC
  Administered 2012-02-22 (×4): via INTRAVENOUS

## 2012-02-22 MED ORDER — OXYTOCIN 40 UNITS IN LACTATED RINGERS INFUSION - SIMPLE MED: 62.5000 mL/h | INTRAVENOUS | Status: AC

## 2012-02-22 MED ORDER — FENTANYL CITRATE 0.05 MG/ML IJ SOLN: 25.0000 ug | INTRAMUSCULAR | Status: DC | PRN

## 2012-02-22 MED ORDER — DIPHENHYDRAMINE HCL 50 MG/ML IJ SOLN: 25.0000 mg | INTRAMUSCULAR | Status: DC | PRN

## 2012-02-22 MED ORDER — EPHEDRINE SULFATE 50 MG/ML IJ SOLN
INTRAMUSCULAR | Status: DC | PRN
  Administered 2012-02-22: 5 mg via INTRAVENOUS
  Administered 2012-02-22: 10 mg via INTRAVENOUS
  Administered 2012-02-22: 5 mg via INTRAVENOUS

## 2012-02-22 MED ORDER — SCOPOLAMINE 1 MG/3DAYS TD PT72
1.0000 | MEDICATED_PATCH | Freq: Once | TRANSDERMAL | Status: DC
  Administered 2012-02-22: 1.5 mg via TRANSDERMAL

## 2012-02-22 MED ORDER — DIBUCAINE 1 % RE OINT: 1.0000 "application " | TOPICAL_OINTMENT | RECTAL | Status: DC | PRN

## 2012-02-22 MED ORDER — OXYTOCIN 40 UNITS IN LACTATED RINGERS INFUSION - SIMPLE MED
INTRAVENOUS | Status: DC | PRN
  Administered 2012-02-22: 40 [IU] via INTRAVENOUS

## 2012-02-22 MED ORDER — MEDROXYPROGESTERONE ACETATE 150 MG/ML IM SUSP: 150.0000 mg | INTRAMUSCULAR | Status: DC | PRN

## 2012-02-22 MED ORDER — MEPERIDINE HCL 25 MG/ML IJ SOLN: 6.2500 mg | INTRAMUSCULAR | Status: DC | PRN

## 2012-02-22 MED ORDER — NALBUPHINE HCL 10 MG/ML IJ SOLN
5.0000 mg | INTRAMUSCULAR | Status: DC | PRN
  Filled 2012-02-22: qty 1

## 2012-02-22 MED ORDER — DIPHENHYDRAMINE HCL 50 MG/ML IJ SOLN: 12.5000 mg | INTRAMUSCULAR | Status: DC | PRN

## 2012-02-22 MED ORDER — SIMETHICONE 80 MG PO CHEW
80.0000 mg | CHEWABLE_TABLET | Freq: Three times a day (TID) | ORAL | Status: DC
  Administered 2012-02-22 – 2012-02-24 (×7): 80 mg via ORAL

## 2012-02-22 MED ORDER — KETOROLAC TROMETHAMINE 30 MG/ML IJ SOLN
INTRAMUSCULAR | Status: AC
  Administered 2012-02-22: 30 mg via INTRAVENOUS
  Filled 2012-02-22: qty 1

## 2012-02-22 MED ORDER — SIMETHICONE 80 MG PO CHEW: 80.0000 mg | CHEWABLE_TABLET | ORAL | Status: DC | PRN

## 2012-02-22 MED ORDER — KETOROLAC TROMETHAMINE 30 MG/ML IJ SOLN
30.0000 mg | Freq: Four times a day (QID) | INTRAMUSCULAR | Status: AC | PRN
  Administered 2012-02-22: 30 mg via INTRAVENOUS

## 2012-02-22 MED ORDER — METHYLDOPA 500 MG PO TABS
500.0000 mg | ORAL_TABLET | Freq: Two times a day (BID) | ORAL | Status: DC
  Filled 2012-02-22 (×6): qty 1

## 2012-02-22 MED ORDER — DIPHENHYDRAMINE HCL 25 MG PO CAPS: 25.0000 mg | ORAL_CAPSULE | ORAL | Status: DC | PRN

## 2012-02-22 MED ORDER — WITCH HAZEL-GLYCERIN EX PADS: 1.0000 "application " | MEDICATED_PAD | CUTANEOUS | Status: DC | PRN

## 2012-02-22 MED ORDER — ONDANSETRON HCL 4 MG PO TABS: 4.0000 mg | ORAL_TABLET | ORAL | Status: DC | PRN

## 2012-02-22 MED ORDER — MENTHOL 3 MG MT LOZG: 1.0000 | LOZENGE | OROMUCOSAL | Status: DC | PRN

## 2012-02-22 MED ORDER — SODIUM CHLORIDE 0.9 % IV SOLN: 1.0000 ug/kg/h | INTRAVENOUS | Status: DC | PRN

## 2012-02-22 MED ORDER — MORPHINE SULFATE (PF) 0.5 MG/ML IJ SOLN
INTRAMUSCULAR | Status: DC | PRN
  Administered 2012-02-22: .1 mg via INTRATHECAL

## 2012-02-22 MED ORDER — NALOXONE HCL 0.4 MG/ML IJ SOLN: 0.4000 mg | INTRAMUSCULAR | Status: DC | PRN

## 2012-02-22 MED ORDER — KETOROLAC TROMETHAMINE 30 MG/ML IJ SOLN: 30.0000 mg | Freq: Four times a day (QID) | INTRAMUSCULAR | Status: AC | PRN

## 2012-02-22 MED ORDER — LANOLIN HYDROUS EX OINT: 1.0000 "application " | TOPICAL_OINTMENT | CUTANEOUS | Status: DC | PRN

## 2012-02-22 MED ORDER — SENNOSIDES-DOCUSATE SODIUM 8.6-50 MG PO TABS
2.0000 | ORAL_TABLET | Freq: Every day | ORAL | Status: DC
  Administered 2012-02-22 – 2012-02-23 (×2): 2 via ORAL

## 2012-02-22 MED ORDER — SODIUM CHLORIDE 0.9 % IJ SOLN: 3.0000 mL | INTRAMUSCULAR | Status: DC | PRN

## 2012-02-22 MED ORDER — ONDANSETRON HCL 4 MG/2ML IJ SOLN
INTRAMUSCULAR | Status: DC | PRN
  Administered 2012-02-22: 4 mg via INTRAVENOUS

## 2012-02-22 MED ORDER — ONDANSETRON HCL 4 MG/2ML IJ SOLN: 4.0000 mg | Freq: Three times a day (TID) | INTRAMUSCULAR | Status: DC | PRN

## 2012-02-22 MED ORDER — BUPIVACAINE IN DEXTROSE 0.75-8.25 % IT SOLN
INTRATHECAL | Status: DC | PRN
  Administered 2012-02-22: 1.4 mL via INTRATHECAL

## 2012-02-22 MED ORDER — METOCLOPRAMIDE HCL 5 MG/ML IJ SOLN: 10.0000 mg | Freq: Three times a day (TID) | INTRAMUSCULAR | Status: DC | PRN

## 2012-02-22 MED ORDER — FENTANYL CITRATE 0.05 MG/ML IJ SOLN
INTRAMUSCULAR | Status: DC | PRN
  Administered 2012-02-22: 12.5 ug via INTRATHECAL
  Administered 2012-02-22: 25 ug via INTRAVENOUS

## 2012-02-22 MED ORDER — DEXTROSE IN LACTATED RINGERS 5 % IV SOLN: INTRAVENOUS | Status: DC

## 2012-02-22 MED ORDER — PHENYLEPHRINE HCL 10 MG/ML IJ SOLN
INTRAMUSCULAR | Status: DC | PRN
  Administered 2012-02-22 (×2): 40 ug via INTRAVENOUS
  Administered 2012-02-22 (×6): 80 ug via INTRAVENOUS

## 2012-02-22 MED ORDER — ONDANSETRON HCL 4 MG/2ML IJ SOLN: 4.0000 mg | INTRAMUSCULAR | Status: DC | PRN

## 2012-02-22 MED ORDER — IBUPROFEN 600 MG PO TABS
600.0000 mg | ORAL_TABLET | Freq: Four times a day (QID) | ORAL | Status: DC
  Administered 2012-02-23 – 2012-02-24 (×6): 600 mg via ORAL
  Filled 2012-02-22 (×7): qty 1

## 2012-02-22 MED ORDER — PRENATAL MULTIVITAMIN CH
1.0000 | ORAL_TABLET | Freq: Every day | ORAL | Status: DC
  Administered 2012-02-23 – 2012-02-24 (×2): 1 via ORAL
  Filled 2012-02-22 (×2): qty 1

## 2012-02-22 MED ORDER — OXYCODONE-ACETAMINOPHEN 5-325 MG PO TABS
1.0000 | ORAL_TABLET | ORAL | Status: DC | PRN
  Administered 2012-02-23 – 2012-02-24 (×2): 1 via ORAL
  Filled 2012-02-22 (×2): qty 1

## 2012-02-22 MED ORDER — MEASLES, MUMPS & RUBELLA VAC ~~LOC~~ INJ: 0.5000 mL | INJECTION | Freq: Once | SUBCUTANEOUS | Status: DC

## 2012-02-22 SURGICAL SUPPLY — 29 items
ADH SKN CLS APL DERMABOND .7 (GAUZE/BANDAGES/DRESSINGS) ×1
CHLORAPREP W/TINT 26ML (MISCELLANEOUS) ×2 IMPLANT
CLOTH BEACON ORANGE TIMEOUT ST (SAFETY) ×2 IMPLANT
DERMABOND ADVANCED (GAUZE/BANDAGES/DRESSINGS) ×1
DERMABOND ADVANCED .7 DNX12 (GAUZE/BANDAGES/DRESSINGS) IMPLANT
ELECT REM PT RETURN 9FT ADLT (ELECTROSURGICAL) ×2
ELECTRODE REM PT RTRN 9FT ADLT (ELECTROSURGICAL) ×1 IMPLANT
EXTRACTOR VACUUM M CUP 4 TUBE (SUCTIONS) ×1 IMPLANT
GLOVE BIO SURGEON STRL SZ 6.5 (GLOVE) ×2 IMPLANT
GLOVE BIOGEL PI IND STRL 7.0 (GLOVE) ×2 IMPLANT
GLOVE BIOGEL PI INDICATOR 7.0 (GLOVE) ×2
GOWN PREVENTION PLUS LG XLONG (DISPOSABLE) ×6 IMPLANT
GOWN STRL REIN XL XLG (GOWN DISPOSABLE) ×2 IMPLANT
KIT ABG SYR 3ML LUER SLIP (SYRINGE) ×2 IMPLANT
NDL HYPO 25X5/8 SAFETYGLIDE (NEEDLE) ×1 IMPLANT
NEEDLE HYPO 25X5/8 SAFETYGLIDE (NEEDLE) ×2 IMPLANT
NS IRRIG 1000ML POUR BTL (IV SOLUTION) ×2 IMPLANT
PACK C SECTION WH (CUSTOM PROCEDURE TRAY) ×2 IMPLANT
SLEEVE SCD COMPRESS KNEE MED (MISCELLANEOUS) IMPLANT
STAPLER VISISTAT 35W (STAPLE) IMPLANT
SUT CHROMIC 0 CT 802H (SUTURE) IMPLANT
SUT CHROMIC 0 CTX 36 (SUTURE) ×6 IMPLANT
SUT MON AB-0 CT1 36 (SUTURE) ×2 IMPLANT
SUT PDS AB 0 CTX 60 (SUTURE) ×2 IMPLANT
SUT PLAIN 0 NONE (SUTURE) IMPLANT
SUT VIC AB 4-0 KS 27 (SUTURE) IMPLANT
TOWEL OR 17X24 6PK STRL BLUE (TOWEL DISPOSABLE) ×4 IMPLANT
TRAY FOLEY CATH 14FR (SET/KITS/TRAYS/PACK) IMPLANT
WATER STERILE IRR 1000ML POUR (IV SOLUTION) ×2 IMPLANT

## 2012-02-22 NOTE — Transfer of Care (Signed)
Immediate Anesthesia Transfer of Care Note  Patient: Katherine [name]  Procedure(s) Performed: Procedure(s) (LRB): CESAREAN SECTION (N/A)  Patient Location: PACU  Anesthesia Type: Spinal  Level of Consciousness: awake, alert  and oriented  Airway & Oxygen Therapy: Patient Spontanous Breathing  Post-op Assessment: Report given to PACU RN and Post -op Vital signs reviewed and stable  Post vital signs: Reviewed and stable  Complications: No apparent anesthesia complications

## 2012-02-22 NOTE — Op Note (Signed)
Cesarean Section Procedure Note   Katherine [name]  02/22/2012  Indications: Scheduled Proceedure/Maternal Request   Pre-operative Diagnosis: Previous Cesarean Section; History of Hypertension; Large for Gestational Age.   Post-operative Diagnosis: Same   Surgeon: Surgeon(s) and Role:    * Zelphia Cairo, MD - Primary   Assistants: none  Anesthesia: spinal   Procedure Details:  The patient was seen in the Holding Room. The risks, benefits, complications, treatment options, and expected outcomes were discussed with the patient. The patient concurred with the proposed plan, giving informed consent. identified as [name] and the procedure verified as C-Section Delivery. A Time Out was held and the above information confirmed.  After induction of anesthesia, the patient was draped and prepped in the usual sterile manner. A transverse was made and carried down through the subcutaneous tissue to the fascia. Fascial incision was made and extended transversely. The fascia was separated from the underlying rectus tissue superiorly and inferiorly. The peritoneum was identified and entered. Peritoneal incision was extended longitudinally. The utero-vesical peritoneal reflection was incised transversely and the bladder flap was bluntly freed from the lower uterine segment. A low transverse uterine incision was made. Delivered from cephalic presentation was a viable female infant. Cord ph was not sent the umbilical cord was clamped and cut cord blood was obtained for evaluation. The placenta was removed Intact and appeared normal. The uterine outline, tubes and ovaries appeared normal}. The uterine incision was closed with running locked sutures of 0chromic gut.   Hemostasis was observed. Lavage was carried out until clear. The fascia was then reapproximated with running sutures of 0PDS. The skin was closed with 4-0Vicryl.   Instrument, sponge, and needle counts were correct prior the abdominal closure and  were not correct at the conclusion of the case.     Estimated Blood Loss: 500cc  Urine Output: clear  Specimens: @ORSPECIMEN @   Complications: no complications  Disposition: PACU - hemodynamically stable.   Maternal Condition: stable   Baby condition / location:  nursery-stable  Attending Attestation: I was present and scrubbed for the entire procedure.   Signed: Surgeon(s): Zelphia Cairo, MD

## 2012-02-22 NOTE — Anesthesia Postprocedure Evaluation (Signed)
Anesthesia Post Note  Patient: Katherine [name]  Procedure(s) Performed: Procedure(s) (LRB): CESAREAN SECTION (N/A)  Anesthesia type: Spinal  Patient location: PACU  Post pain: Pain level controlled  Post assessment: Post-op Vital signs reviewed  Last Vitals:  Filed Vitals:   02/22/12 1400  BP: 98/42  Pulse: 95  Temp:   Resp: 18    Post vital signs: Reviewed  Level of consciousness: awake  Complications: No apparent anesthesia complications

## 2012-02-22 NOTE — Anesthesia Preprocedure Evaluation (Signed)
Anesthesia Evaluation  Patient identified by MRN, date of birth, ID band Patient awake    Reviewed: Allergy & Precautions, H&P , NPO status , Patient's Chart, lab work & pertinent test results  Airway Mallampati: III TM Distance: >3 FB     Dental No notable dental hx. (+) Teeth Intact   Pulmonary neg pulmonary ROS,  breath sounds clear to auscultation  Pulmonary exam normal       Cardiovascular hypertension, Pt. on medications Rhythm:Regular Rate:Normal     Neuro/Psych negative neurological ROS  negative psych ROS   GI/Hepatic negative GI ROS, Neg liver ROS,   Endo/Other  Morbid obesity  Renal/GU negative Renal ROS  negative genitourinary   Musculoskeletal negative musculoskeletal ROS (+)   Abdominal (+) + obese,   Peds  Hematology negative hematology ROS (+)   Anesthesia Other Findings   Reproductive/Obstetrics (+) Pregnancy PCOS                           Anesthesia Physical Anesthesia Plan  ASA: II  Anesthesia Plan: Spinal   Post-op Pain Management:    Induction:   Airway Management Planned: Natural Airway  Additional Equipment:   Intra-op Plan:   Post-operative Plan:   Informed Consent: I have reviewed the patients History and Physical, chart, labs and discussed the procedure including the risks, benefits and alternatives for the proposed anesthesia with the patient or authorized representative who has indicated his/her understanding and acceptance.     Plan Discussed with: Anesthesiologist, Surgeon and CRNA  Anesthesia Plan Comments:         Anesthesia Quick Evaluation

## 2012-02-22 NOTE — Anesthesia Procedure Notes (Signed)
Spinal  Patient location during procedure: OR Start time: 02/22/2012 1:01 PM End time: 02/22/2012 1:03 PM Staffing Anesthesiologist: Sandrea Hughs Performed by: anesthesiologist  Preanesthetic Checklist Completed: patient identified, site marked, surgical consent, pre-op evaluation, timeout performed, IV checked, risks and benefits discussed and monitors and equipment checked Spinal Block Patient position: sitting Prep: DuraPrep Patient monitoring: heart rate, cardiac monitor, continuous pulse ox and blood pressure Approach: midline Location: L3-4 Injection technique: single-shot Needle Needle type: Sprotte  Needle gauge: 24 G Needle length: 9 cm Needle insertion depth: 5 cm Assessment Sensory level: T4

## 2012-02-23 ENCOUNTER — Encounter (HOSPITAL_COMMUNITY): Payer: Self-pay | Admitting: Obstetrics and Gynecology

## 2012-02-23 LAB — CBC
HCT: 25.4 % — ABNORMAL LOW (ref 36.0–46.0)
MCHC: 32.7 g/dL (ref 30.0–36.0)
Platelets: 137 10*3/uL — ABNORMAL LOW (ref 150–400)
RDW: 12.7 % (ref 11.5–15.5)

## 2012-02-23 NOTE — Anesthesia Postprocedure Evaluation (Signed)
  Anesthesia Post-op Note  Patient: Katherine [name]  Procedure(s) Performed: Procedure(s) (LRB): CESAREAN SECTION (N/A)  Patient Location: Mother/Baby  Anesthesia Type: Spinal  Level of Consciousness: awake, alert  and oriented  Airway and Oxygen Therapy: Patient Spontanous Breathing  Post-op Pain: mild  Post-op Assessment: Patient's Cardiovascular Status Stable, Respiratory Function Stable, Patent Airway, No signs of Nausea or vomiting and Pain level controlled  Post-op Vital Signs: stable  Complications: No apparent anesthesia complications

## 2012-02-23 NOTE — Progress Notes (Signed)
Subjective: Postpartum Day 1 Cesarean Delivery Patient reports incisional pain, tolerating PO and no problems voiding.    Objective: Vital signs in last 24 hours: Temp:  [97.3 F (36.3 C)-98.4 F (36.9 C)] 98.1 F (36.7 C) (07/04 0500) Pulse Rate:  [62-95] 85  (07/04 0500) Resp:  [16-20] 18  (07/04 0500) BP: (88-116)/(42-79) 105/69 mmHg (07/04 0500) SpO2:  [96 %-100 %] 98 % (07/04 0500) Weight:  [101.152 kg (223 lb)] 101.152 kg (223 lb) (07/03 1510)  Physical Exam:  General: alert Lochia: appropriate Uterine Fundus: firm Incision: healing well, no significant drainage, no dehiscence, no significant erythema DVT Evaluation: No evidence of DVT seen on physical exam.   Basename 02/23/12 0527 02/20/12 1402  HGB 8.3* 10.3*  HCT 25.4* 31.3*    Assessment/Plan: Status post Cesarean section. Doing well postoperatively.  Continue current care Circ today - patients consent to the procedure.  Roger Fasnacht L 02/23/2012, 7:17 AM

## 2012-02-23 NOTE — Addendum Note (Signed)
Addendum  created 02/23/12 1003 by Lincoln Brigham, CRNA   Modules edited:Notes Section

## 2012-02-24 LAB — CBC
MCH: 28.4 pg (ref 26.0–34.0)
MCHC: 32.9 g/dL (ref 30.0–36.0)
RBC: 3.24 MIL/uL — ABNORMAL LOW (ref 3.87–5.11)
RDW: 13 % (ref 11.5–15.5)

## 2012-02-24 MED ORDER — OXYCODONE-ACETAMINOPHEN 5-325 MG PO TABS: 1.0000 | ORAL_TABLET | ORAL | Status: AC | PRN

## 2012-02-24 NOTE — Progress Notes (Signed)
Subjective: Postpartum Day 2: Cesarean Delivery Patient reports tolerating PO and no problems voiding.  Has not passed flatus  Objective: Vital signs in last 24 hours: Temp:  [98.1 F (36.7 C)-99.3 F (37.4 C)] 98.2 F (36.8 C) (07/05 0514) Pulse Rate:  [82-88] 83  (07/05 0514) Resp:  [18-20] 18  (07/05 0514) BP: (105-119)/(68-74) 109/74 mmHg (07/05 0514) SpO2:  [98 %-99 %] 98 % (07/04 2145)  Physical Exam:  General: alert and cooperative Lochia: appropriate Uterine Fundus: firm Incision: healing well DVT Evaluation: No evidence of DVT seen on physical exam.   Basename 02/24/12 0508 02/23/12 0527  HGB 9.2* 8.3*  HCT 28.0* 25.4*    Assessment/Plan: Status post Cesarean section. Doing well postoperatively.  Continue current care Encouraged warm beverages Possible discharge later this pm.  Stepheni Cameron G 02/24/2012, 8:44 AM

## 2012-02-25 NOTE — Discharge Summary (Signed)
Obstetric Discharge Summary Reason for Admission: cesarean section Prenatal Procedures: none Intrapartum Procedures: cesarean: low cervical, transverse Postpartum Procedures: none Complications-Operative and Postpartum: none Hemoglobin  Date Value Range Status  02/24/2012 9.2* 12.0 - 15.0 g/dL Final     HCT  Date Value Range Status  02/24/2012 28.0* 36.0 - 46.0 % Final    Physical Exam:  General: alert, cooperative and appears stated age 31: appropriate Uterine Fundus: firm Incision: healing well, no significant drainage, no dehiscence, no significant erythema DVT Evaluation: No evidence of DVT seen on physical exam.  Discharge Diagnoses: Term Pregnancy-delivered  Discharge Information: Date: 02/25/2012 Activity: pelvic rest Diet: routine Medications: Ibuprofen and Percocet Condition: improved Instructions: refer to practice specific booklet Discharge to: home   Newborn Data: Live born female  Birth Weight: 8 lb 7.5 oz (3840 g) APGAR: 8, 8  Home with mother.  Katherine [name] 02/25/2012, 8:16 AM

## 2012-08-02 ENCOUNTER — Encounter: Payer: Self-pay | Admitting: Family Medicine

## 2012-08-02 ENCOUNTER — Ambulatory Visit (INDEPENDENT_AMBULATORY_CARE_PROVIDER_SITE_OTHER): Payer: Managed Care, Other (non HMO) | Admitting: Family Medicine

## 2012-08-02 VITALS — BP 132/88 | HR 87 | Ht 66.0 in | Wt 178.0 lb

## 2012-08-02 DIAGNOSIS — L0390 Cellulitis, unspecified: Secondary | ICD-10-CM

## 2012-08-02 DIAGNOSIS — R5381 Other malaise: Secondary | ICD-10-CM

## 2012-08-02 DIAGNOSIS — R5383 Other fatigue: Secondary | ICD-10-CM

## 2012-08-02 DIAGNOSIS — L0291 Cutaneous abscess, unspecified: Secondary | ICD-10-CM

## 2012-08-02 DIAGNOSIS — I10 Essential (primary) hypertension: Secondary | ICD-10-CM

## 2012-08-02 DIAGNOSIS — R51 Headache: Secondary | ICD-10-CM

## 2012-08-02 DIAGNOSIS — D649 Anemia, unspecified: Secondary | ICD-10-CM

## 2012-08-02 MED ORDER — METHYLDOPA 500 MG PO TABS: 500.0000 mg | ORAL_TABLET | Freq: Two times a day (BID) | ORAL | Status: DC

## 2012-08-02 MED ORDER — HYDROCHLOROTHIAZIDE 25 MG PO TABS: 12.5000 mg | ORAL_TABLET | Freq: Every day | ORAL | Status: DC

## 2012-08-02 NOTE — Progress Notes (Signed)
  Subjective:    Patient ID: Katherine [name], female    DOB: 29-Aug-1980, 31 y.o.   MRN: [id number]  HPI #1 patient reports having high blood pressure. She reports her pregnancy despite anemia she did hypertension by using methyldopa. She's had an abscess and arm that's improved but they were concerned because over the last 2 weeks she's had elevation of her blood pressure when she was seen. #2 abscess left arm healing well . #3 history of anemia/fatigue and headache not checked since July and at that time she was still markedly anemic H&H of 8.3 and 25.4.    Review of Systems  Constitutional: Positive for fatigue.  Neurological: Positive for headaches.      BP 132/88  Pulse 87  Ht 5\' 6"  (1.676 m)  Wt 178 lb (80.74 kg)  BMI 28.73 kg/m2 Objective:   Physical Exam  Constitutional: She is oriented to person, place, and time. She appears well-developed and well-nourished.  HENT:  Head: Normocephalic and atraumatic.  Neck: Normal range of motion. Neck supple.  Cardiovascular: Normal rate, regular rhythm, normal heart sounds and intact distal pulses.   Neurological: She is alert and oriented to person, place, and time.  Skin: Skin is warm.   Results for orders placed during the hospital encounter of 02/22/12  CBC      Component Value Range   WBC 5.0  4.0 - 10.5 K/uL   RBC 2.96 (*) 3.87 - 5.11 MIL/uL   Hemoglobin 8.3 (*) 12.0 - 15.0 g/dL   HCT 08.6 (*) 57.8 - 46.9 %   MCV 85.8  78.0 - 100.0 fL   MCH 28.0  26.0 - 34.0 pg   MCHC 32.7  30.0 - 36.0 g/dL   RDW 62.9  52.8 - 41.3 %   Platelets 137 (*) 150 - 400 K/uL  CBC      Component Value Range   WBC 6.3  4.0 - 10.5 K/uL   RBC 3.24 (*) 3.87 - 5.11 MIL/uL   Hemoglobin 9.2 (*) 12.0 - 15.0 g/dL   HCT 24.4 (*) 01.0 - 27.2 %   MCV 86.4  78.0 - 100.0 fL   MCH 28.4  26.0 - 34.0 pg   MCHC 32.9  30.0 - 36.0 g/dL   RDW 53.6  64.4 - 03.4 %   Platelets 168  150 - 400 K/uL      Assessment & Plan:  #1 hypertension. While her blood pressure  today is not that greatly elevated it is on the high range of normal. With her history of being anemic and having hypertension during pregnancy I would recommend we add hydrochlorothiazide 25 mg only one half a tablet a day and see how her blood pressure does with that with the methyldopa twice a day. We'll get a CMP to make sure kidney function is okay. #2 history of anemia /fatigue/and headaches. Also will recommend CBC and a TSH to make sure there is no problem there. #3 abscess healing continue her current antibiotics.

## 2012-08-02 NOTE — Patient Instructions (Signed)
Anemia, Frequently Asked Questions WHAT ARE THE SYMPTOMS OF ANEMIA?  Headache.  Difficulty thinking.  Fatigue.  Shortness of breath.  Weakness.  Rapid heartbeat. AT WHAT POINT ARE PEOPLE CONSIDERED ANEMIC?  This varies with gender and age.   Both hemoglobin (Hgb) and hematocrit values are used to define anemia. These lab values are obtained from a complete blood count (CBC) test. This is performed at a caregiver's office.  The normal range of hemoglobin values for adult men is 14.0 g/dL to 17.4 g/dL. For nonpregnant women, values are 12.3 g/dL to 15.3 g/dL.  The World Health Organization defines anemia as less than 12 g/dL for nonpregnant women and less than 13 g/dL for men.  For adult males, the average normal hematocrit is 46%, and the range is 40% to 52%.  For adult females, the average normal hematocrit is 41%, and the range is 35% to 47%.  Values that fall below the lower limits can be a sign of anemia and should have further checking (evaluation). GROUPS OF PEOPLE WHO ARE AT RISK FOR DEVELOPING ANEMIA INCLUDE:   Infants who are breastfed or taking a formula that is not fortified with iron.  Children going through a rapid growth spurt. The iron available can not keep up with the needs for a red cell mass which must grow with the child.  Women in childbearing years. They need iron because of blood loss during menstruation.  Pregnant women. The growing fetus creates a high demand for iron.  People with ongoing gastrointestinal blood loss are at risk of developing iron deficiency.  Individuals with leukemia or cancer who must receive chemotherapy or radiation to treat their disease. The drugs or radiation used to treat these diseases often decreases the bone marrow's ability to make cells of all classes. This includes red blood cells, white blood cells, and platelets.  Individuals with chronic inflammatory conditions such as rheumatoid arthritis or chronic  infections.  The elderly. ARE SOME TYPES OF ANEMIA INHERITED?   Yes, some types of anemia are due to inherited or genetic defects.  Sickle cell anemia. This occurs most often in people of African, African American, and Mediterranean descent.  Thalassemia (or Cooley's anemia). This type is found in people of Mediterranean and Southeast Asian descent. These types of anemia are common.  [place]i. This is rare. CAN CERTAIN MEDICATIONS CAUSE A PERSON TO BECOME ANEMIC?  Yes. For example, drugs to fight cancer (chemotherapeutic agents) often cause anemia. These drugs can slow the bone marrow's ability to make red blood cells. If there are not enough red blood cells, the body does not get enough oxygen. WHAT HEMATOCRIT LEVEL IS REQUIRED TO DONATE BLOOD?  The lower limit of an acceptable hematocrit for blood donors is 38%. If you have a low hematocrit value, you should schedule an appointment with your caregiver. ARE BLOOD TRANSFUSIONS COMMONLY USED TO CORRECT ANEMIA, AND ARE THEY DANGEROUS?  They are used to treat anemia as a last resort. Your caregiver will find the cause of the anemia and correct it if possible. Most blood transfusions are given because of excessive bleeding at the time of surgery, with trauma, or because of bone marrow suppression in patients with cancer or leukemia on chemotherapy. Blood transfusions are safer than ever before. We also know that blood transfusions affect the immune system and may increase certain risks. There is also a concern for human error. In 1/16,000 transfusions, a patient receives a transfusion of blood that is not matched with his or her   blood type.  WHAT IS IRON DEFICIENCY ANEMIA AND CAN I CORRECT IT BY CHANGING MY DIET?  Iron is an essential part of hemoglobin. Without enough hemoglobin, anemia develops and the body does not get the right amount of oxygen. Iron deficiency anemia develops after the body has had a low level of iron for a long time. This is  either caused by blood loss, not taking in or absorbing enough iron, or increased demands for iron (like pregnancy or rapid growth).  Foods from animal origin such as beef, chicken, and pork, are good sources of iron. Be sure to have one of these foods at each meal. Vitamin C helps your body absorb iron. Foods rich in Vitamin C include citrus, bell pepper, strawberries, spinach and cantaloupe. In some cases, iron supplements may be needed in order to correct the iron deficiency. In the case of poor absorption, extra iron may have to be given directly into the vein through a needle (intravenously). I HAVE BEEN DIAGNOSED WITH IRON DEFICIENCY ANEMIA AND MY CAREGIVER PRESCRIBED IRON SUPPLEMENTS. HOW LONG WILL IT TAKE FOR MY BLOOD TO BECOME NORMAL?  It depends on the degree of anemia at the beginning of treatment. Most people with mild to moderate iron deficiency, anemia will correct the anemia over a period of 2 to 3 months. But after the anemia is corrected, the iron stored by the body is still low. Caregivers often suggest an additional 6 months of oral iron therapy once the anemia has been reversed. This will help prevent the iron deficiency anemia from quickly happening again. Non-anemic adult males should take iron supplements only under the direction of a doctor, too much iron can cause liver damage.  MY HEMOGLOBIN IS 9 G/DL AND I AM SCHEDULED FOR SURGERY. SHOULD I POSTPONE THE SURGERY?  If you have Hgb of 9, you should discuss this with your caregiver right away. Many patients with similar hemoglobin levels have had surgery without problems. If minimal blood loss is expected for a minor procedure, no treatment may be necessary.  If a greater blood loss is expected for more extensive procedures, you should ask your caregiver about being treated with erythropoietin and iron. This is to accelerate the recovery of your hemoglobin to a normal level before surgery. An anemic patient who undergoes high-blood-loss  surgery has a greater risk of surgical complications and need for a blood transfusion, which also carries some risk.  I HAVE BEEN TOLD THAT HEAVY MENSTRUAL PERIODS CAUSE ANEMIA. IS THERE ANYTHING I CAN DO TO PREVENT THE ANEMIA?  Anemia that results from heavy periods is usually due to iron deficiency. You can try to meet the increased demands for iron caused by the heavy monthly blood loss by increasing the intake of iron-rich foods. Iron supplements may be required. Discuss your concerns with your caregiver. WHAT CAUSES ANEMIA DURING PREGNANCY?  Pregnancy places major demands on the body. The mother must meet the needs of both her body and her growing baby. The body needs enough iron and folate to make the right amount of red blood cells. To prevent anemia while pregnant, the mother should stay in close contact with her caregiver.  Be sure to eat a diet that has foods rich in iron and folate like liver and dark green leafy vegetables. Folate plays an important role in the normal development of a baby's spinal cord. Folate can help prevent serious disorders like spina bifida. If your diet does not provide adequate nutrients, you may want to talk   with your caregiver about nutritional supplements.  WHAT IS THE RELATIONSHIP BETWEEN FIBROID TUMORS AND ANEMIA IN WOMEN?  The relationship is usually caused by the increased menstrual blood loss caused by fibroids. Good iron intake may be required to prevent iron deficiency anemia from developing.  Document Released: 03/16/2004 Document Revised: 10/31/2011 Document Reviewed: 08/31/2010 [facility] Patient Information 2013 [place], [place]. Hypertension As your heart beats, it forces blood through your arteries. This force is your blood pressure. If the pressure is too high, it is called hypertension (HTN) or high blood pressure. HTN is dangerous because you may have it and not know it. High blood pressure may mean that your heart has to work harder to pump blood. Your  arteries may be narrow or stiff. The extra work puts you at risk for heart disease, stroke, and other problems.  Blood pressure consists of two numbers, a higher number over a lower, 110/72, for example. It is stated as "110 over 72." The ideal is below 120 for the top number (systolic) and under 80 for the bottom (diastolic). Write down your blood pressure today. You should pay close attention to your blood pressure if you have certain conditions such as:  Heart failure.  Prior heart attack.  Diabetes  Chronic kidney disease.  Prior stroke.  Multiple risk factors for heart disease. To see if you have HTN, your blood pressure should be measured while you are seated with your arm held at the level of the heart. It should be measured at least twice. A one-time elevated blood pressure reading (especially in the Emergency Department) does not mean that you need treatment. There may be conditions in which the blood pressure is different between your right and left arms. It is important to see your caregiver soon for a recheck. Most people have essential hypertension which means that there is not a specific cause. This type of high blood pressure may be lowered by changing lifestyle factors such as:  Stress.  Smoking.  Lack of exercise.  Excessive weight.  Drug/tobacco/alcohol use.  Eating less salt. Most people do not have symptoms from high blood pressure until it has caused damage to the body. Effective treatment can often prevent, delay or reduce that damage. TREATMENT  When a cause has been identified, treatment for high blood pressure is directed at the cause. There are a large number of medications to treat HTN. These fall into several categories, and your caregiver will help you select the medicines that are best for you. Medications may have side effects. You should review side effects with your caregiver. If your blood pressure stays high after you have made lifestyle changes or  started on medicines,   Your medication(s) may need to be changed.  Other problems may need to be addressed.  Be certain you understand your prescriptions, and know how and when to take your medicine.  Be sure to follow up with your caregiver within the time frame advised (usually within two weeks) to have your blood pressure rechecked and to review your medications.  If you are taking more than one medicine to lower your blood pressure, make sure you know how and at what times they should be taken. Taking two medicines at the same time can result in blood pressure that is too low. SEEK IMMEDIATE MEDICAL CARE IF:  You develop a severe headache, blurred or changing vision, or confusion.  You have unusual weakness or numbness, or a faint feeling.  You have severe chest or abdominal pain,  vomiting, or breathing problems. MAKE SURE YOU:   Understand these instructions.  Will watch your condition.  Will get help right away if you are not doing well or get worse. Document Released: 08/08/2005 Document Revised: 10/31/2011 Document Reviewed: 03/28/2008 [facility] Patient Information 2013 [place], [place].

## 2012-08-03 LAB — CBC WITH DIFFERENTIAL/PLATELET
Basophils Relative: 0 % (ref 0–1)
HCT: 34.3 % — ABNORMAL LOW (ref 36.0–46.0)
Hemoglobin: 11.5 g/dL — ABNORMAL LOW (ref 12.0–15.0)
Lymphocytes Relative: 39 % (ref 12–46)
Lymphs Abs: 1.9 10*3/uL (ref 0.7–4.0)
MCHC: 33.5 g/dL (ref 30.0–36.0)
Monocytes Absolute: 0.5 10*3/uL (ref 0.1–1.0)
Monocytes Relative: 11 % (ref 3–12)
Neutro Abs: 2.3 10*3/uL (ref 1.7–7.7)
Neutrophils Relative %: 48 % (ref 43–77)
RBC: 4.21 MIL/uL (ref 3.87–5.11)
WBC: 4.9 10*3/uL (ref 4.0–10.5)

## 2012-08-03 LAB — COMPREHENSIVE METABOLIC PANEL
AST: 14 U/L (ref 0–37)
Albumin: 4.9 g/dL (ref 3.5–5.2)
BUN: 13 mg/dL (ref 6–23)
Calcium: 9.6 mg/dL (ref 8.4–10.5)
Chloride: 107 mEq/L (ref 96–112)
Creat: 1.01 mg/dL (ref 0.50–1.10)
Glucose, Bld: 76 mg/dL (ref 70–99)
Potassium: 5 mEq/L (ref 3.5–5.3)

## 2012-08-03 LAB — TSH: TSH: 0.535 u[IU]/mL (ref 0.350–4.500)

## 2012-08-30 ENCOUNTER — Telehealth: Payer: Self-pay | Admitting: *Deleted

## 2012-08-30 NOTE — Telephone Encounter (Signed)
Stop the hctz for now.  Just use the aldomet an f/u in a couple of week to recheck BP  We might need to find a replacement.

## 2012-08-30 NOTE — Telephone Encounter (Signed)
Pt.notified

## 2012-08-30 NOTE — Telephone Encounter (Signed)
Pt calls and states is on the hydrodiuril (HCTZ) and is breast feeding and thinks that it may be decreasing her milk supply and wanted to know if there was anything else she could take

## 2012-09-27 ENCOUNTER — Encounter: Payer: Self-pay | Admitting: Family Medicine

## 2012-09-27 ENCOUNTER — Ambulatory Visit (INDEPENDENT_AMBULATORY_CARE_PROVIDER_SITE_OTHER): Payer: Managed Care, Other (non HMO) | Admitting: Family Medicine

## 2012-09-27 VITALS — BP 119/73 | HR 72 | Ht 65.0 in | Wt 170.0 lb

## 2012-09-27 DIAGNOSIS — Z0000 Encounter for general adult medical examination without abnormal findings: Secondary | ICD-10-CM

## 2012-09-27 LAB — COMPLETE METABOLIC PANEL WITH GFR
ALT: 9 U/L (ref 0–35)
Albumin: 4.3 g/dL (ref 3.5–5.2)
CO2: 23 mEq/L (ref 19–32)
Chloride: 109 mEq/L (ref 96–112)
GFR, Est African American: >89 mL/min
GFR, Est Non African American: >89 mL/min
Glucose, Bld: 75 mg/dL (ref 70–99)
Potassium: 3.9 mEq/L (ref 3.5–5.3)
Sodium: 143 mEq/L (ref 135–145)
Total Bilirubin: 0.4 mg/dL (ref 0.3–1.2)
Total Protein: 6.6 g/dL (ref 6.0–8.3)

## 2012-09-27 LAB — LIPID PANEL
HDL: 63 mg/dL (ref >39)
LDL Cholesterol: 72 mg/dL (ref 0–99)

## 2012-09-27 NOTE — Patient Instructions (Addendum)
Keep up a regular exercise program and make sure you are eating a healthy diet Try to eat 4 servings of dairy a day, or if you are lactose intolerant take a calcium with vitamin D daily.  Your vaccines are up to date.   

## 2012-09-27 NOTE — Progress Notes (Signed)
  Subjective:     Katherine [name] is a 32 y.o. female and is here for a comprehensive physical exam. The patient reports no problems. She is still breastfeeding.    History   Social History  . Marital Status: Married    Spouse Name: [name]    Number of Children: 2  . Years of Education: N/A   Occupational History  . RN     Care Management   Social History Main Topics  . Smoking status: Never Smoker   . Smokeless tobacco: Not on file  . Alcohol Use: No  . Drug Use: No  . Sexually Active: Yes -- Female partner(s)     Comment: [employer] OR, married, regularly exercises.   Other Topics Concern  . Not on file   Social History Narrative   1 caffeine drink per day. No regular exercise.   Health Maintenance  Topic Date Due  . Influenza Vaccine  04/22/2013  . Pap Smear  03/23/2015  . Tetanus/tdap  05/17/2020    The following portions of the patient's history were reviewed and updated as appropriate: allergies, current medications, past family history, past medical history, past social history, past surgical history and problem list.  Review of Systems A comprehensive review of systems was negative.   Objective:    BP 119/73  Pulse 72  Ht 5\' 5"  (1.651 m)  Wt 170 lb (77.111 kg)  BMI 28.29 kg/m2  LMP 08/16/2012 General appearance: alert, cooperative and appears stated age Head: Normocephalic, without obvious abnormality, atraumatic Eyes: conj clear, EOMI, PEERLA Ears: normal TM's and external ear canals both ears Nose: Nares normal. Septum midline. Mucosa normal. No drainage or sinus tenderness. Throat: lips, mucosa, and tongue normal; teeth and gums normal Neck: no adenopathy, no carotid bruit, no JVD, supple, symmetrical, trachea midline and thyroid not enlarged, symmetric, no tenderness/mass/nodules Back: symmetric, no curvature. ROM normal. No CVA tenderness. Lungs: clear to auscultation bilaterally Heart: regular rate and rhythm, S1, S2 normal, no murmur, click, rub or  gallop Abdomen: soft, non-tender; bowel sounds normal; no masses,  no organomegaly Extremities: extremities normal, atraumatic, no cyanosis or edema Pulses: 2+ and symmetric Skin: Skin color, texture, turgor normal. No rashes or lesions Lymph nodes: Cervical, supraclavicular, and axillary nodes normal. Neurologic: Alert and oriented X 3, normal strength and tone. Normal symmetric reflexes. Normal coordination and gait    Assessment:    Healthy female exam.      Plan:     See After Visit Summary for Counseling Recommendations  Keep up a regular exercise program and make sure you are eating a healthy diet Try to eat 4 servings of dairy a day, or if you are lactose intolerant take a calcium with vitamin D daily.  Your vaccines are up to date.

## 2012-09-28 NOTE — Progress Notes (Signed)
Quick Note:  All labs are normal. ______ 

## 2012-10-18 ENCOUNTER — Encounter: Payer: Self-pay | Admitting: Family Medicine

## 2012-12-13 ENCOUNTER — Encounter: Payer: Self-pay | Admitting: Physician Assistant

## 2013-03-27 ENCOUNTER — Ambulatory Visit (INDEPENDENT_AMBULATORY_CARE_PROVIDER_SITE_OTHER): Payer: Managed Care, Other (non HMO) | Admitting: Family Medicine

## 2013-03-27 ENCOUNTER — Encounter: Payer: Self-pay | Admitting: Family Medicine

## 2013-03-27 VITALS — BP 113/76 | HR 75 | Ht 65.0 in | Wt 171.0 lb

## 2013-03-27 DIAGNOSIS — I10 Essential (primary) hypertension: Secondary | ICD-10-CM

## 2013-03-27 NOTE — Progress Notes (Signed)
  Subjective:    Patient ID: Katherine [name], female    DOB: 08-13-1981, 31 y.o.   MRN: [id number]  HPI HTN-  Pt denies chest pain, SOB, dizziness, or heart palpitations.  Taking meds as directed w/o problems.  Denies medication side effects. Tolerating meds well. On aldomet BID.  She is not actively not tring to get pregnant.      Review of Systems     Objective:   Physical Exam  Constitutional: She is oriented to person, place, and time. She appears well-developed and well-nourished.  HENT:  Head: Normocephalic and atraumatic.  Cardiovascular: Normal rate, regular rhythm and normal heart sounds.   Pulmonary/Chest: Effort normal and breath sounds normal.  Neurological: She is alert and oriented to person, place, and time.  Skin: Skin is warm and dry.  Psychiatric: She has a normal mood and affect. Her behavior is normal.          Assessment & Plan:  HTN- well controlled. F/U in 6 months.

## 2013-06-27 ENCOUNTER — Other Ambulatory Visit: Payer: Self-pay

## 2013-08-12 IMAGING — US US FETAL BPP W/O NONSTRESS
1 series · 14 of 15 positions shown · non-contrast
Comparison: none

CLINICAL DATA: Possible rupture of membranes.

[Series 1: us fetal bpp w/o nonstress · non-contrast · 15 acquisitions, 14 frames shown]
[im 1/15]
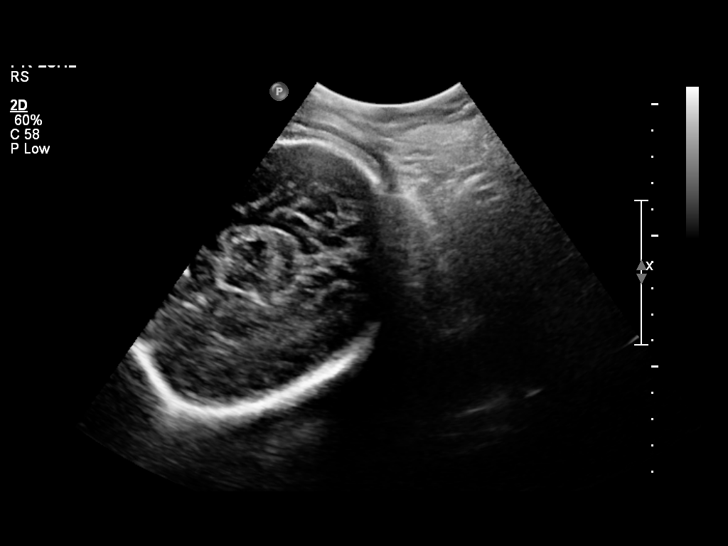
[im 2/15]
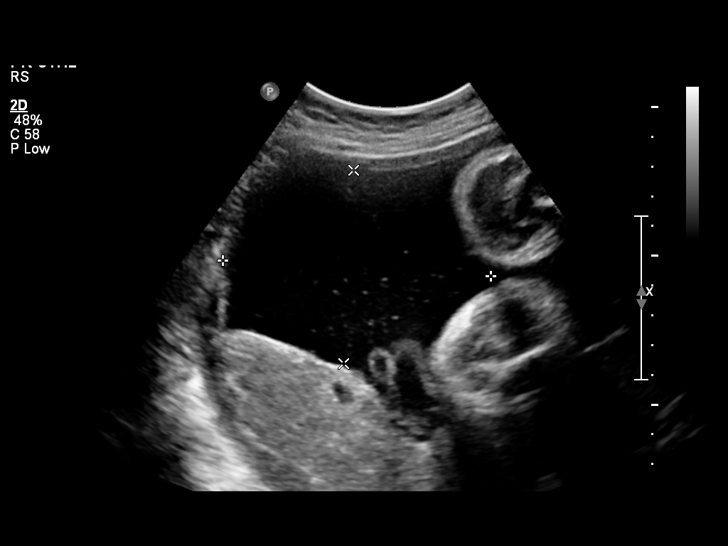
[im 3/15]
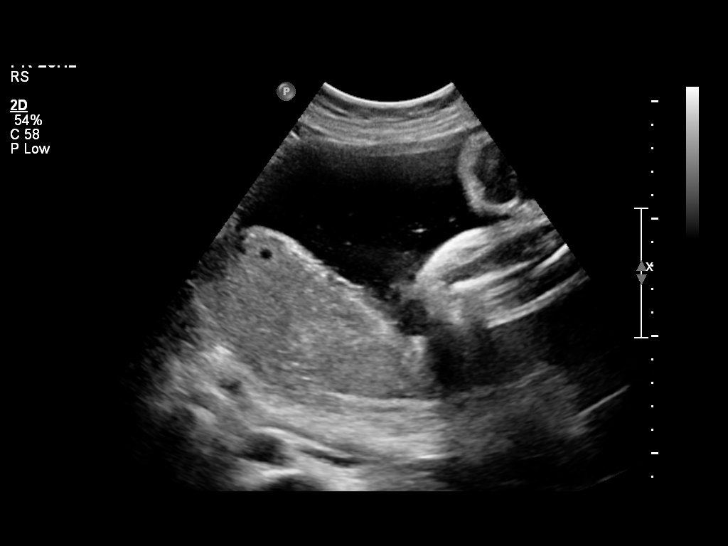
[im 4/15]
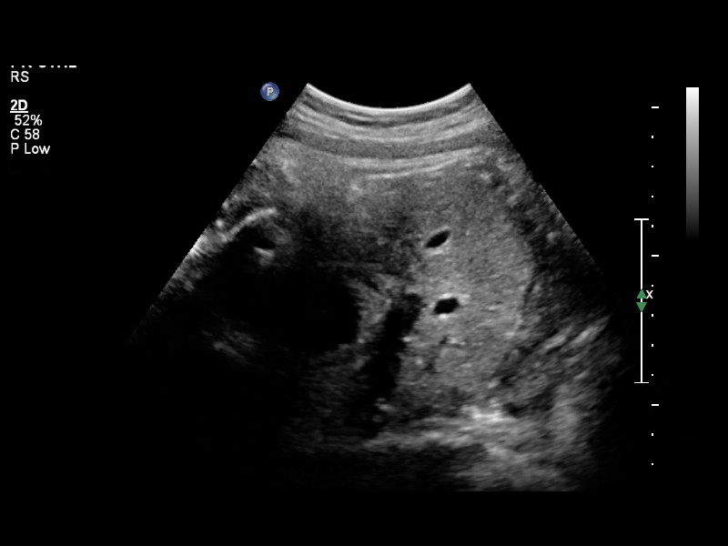
[im 5/15]
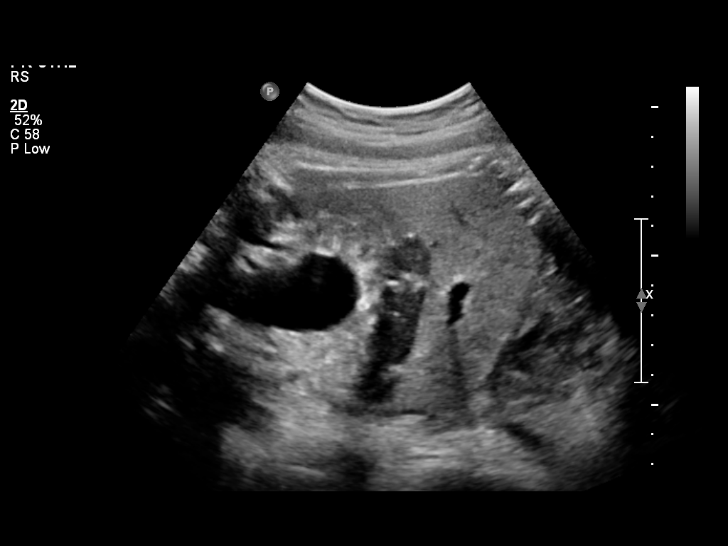
[im 6/15]
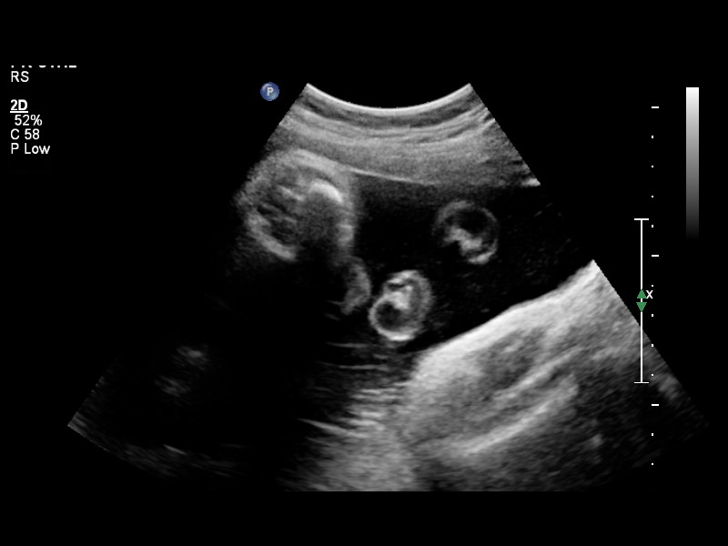
[im 7/15]
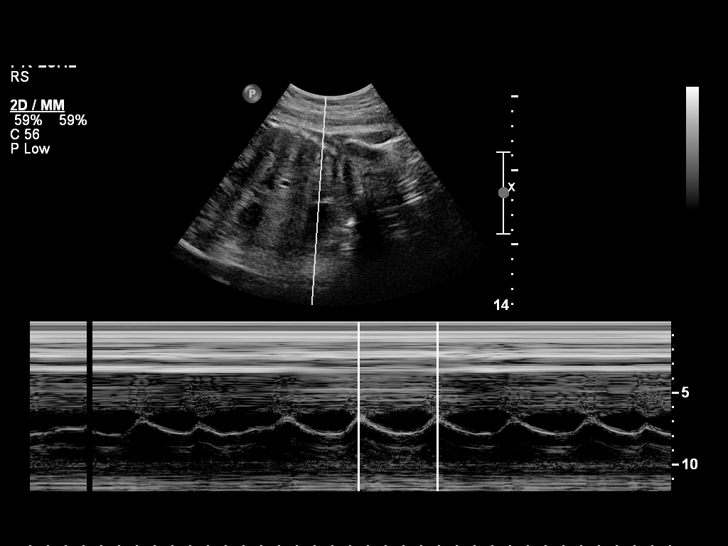
[im 9/15]
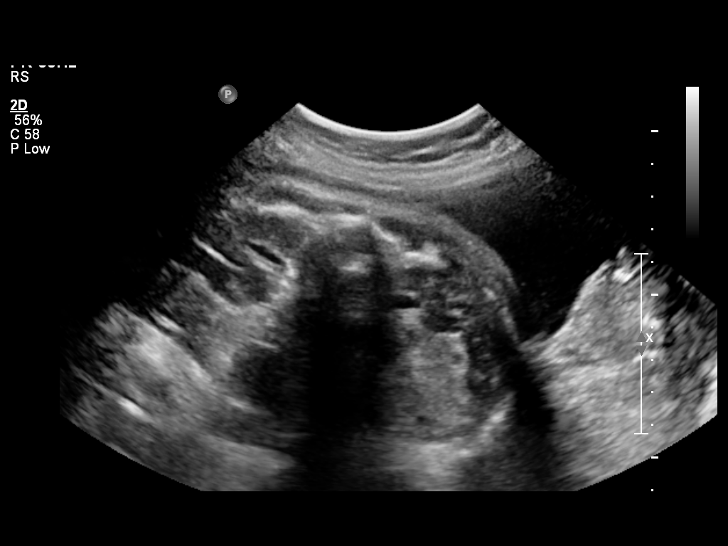
[im 10/15]
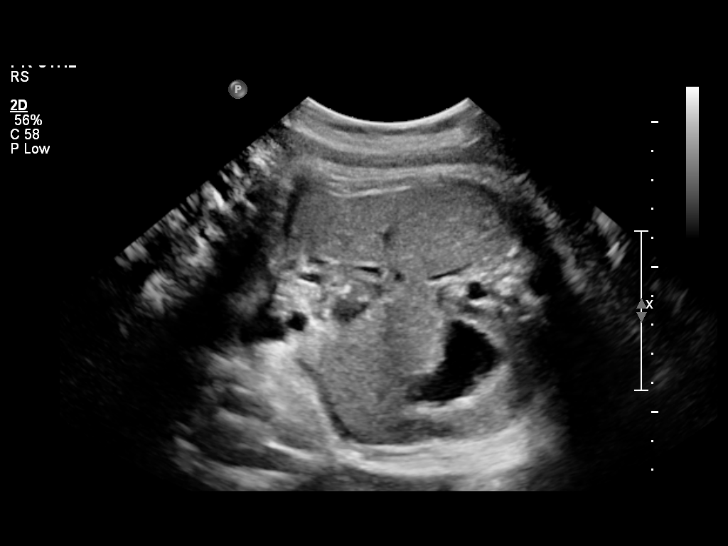
[im 11/15]
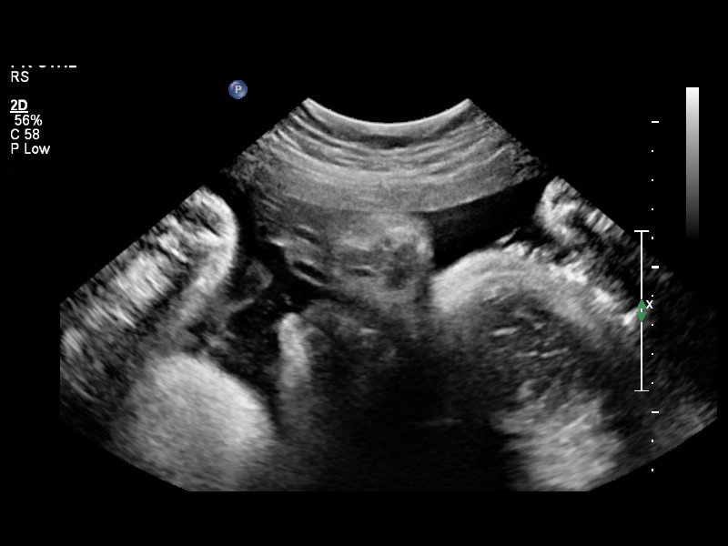
[im 12/15]
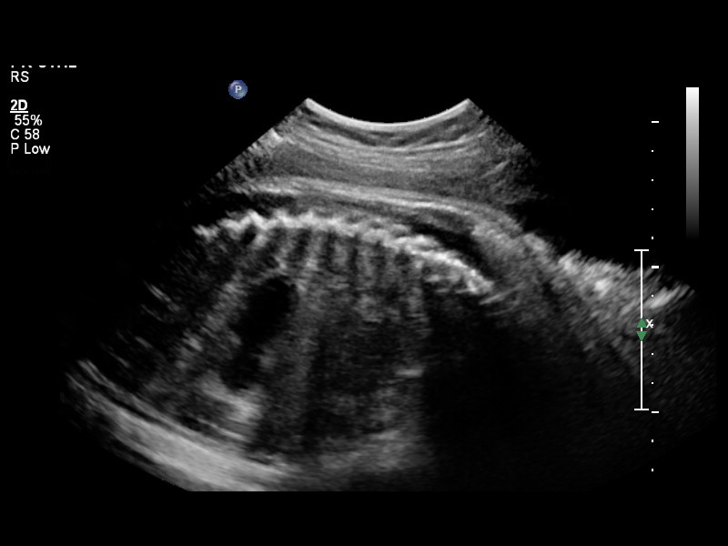
[im 13/15]
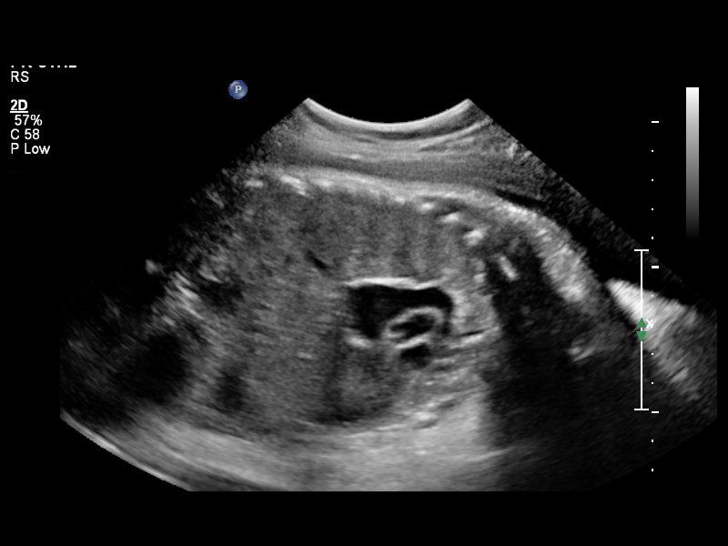
[im 14/15]
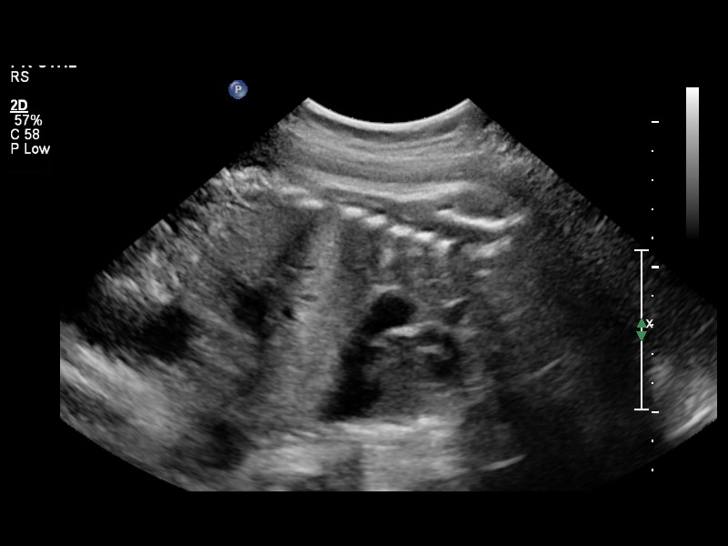
[im 15/15]
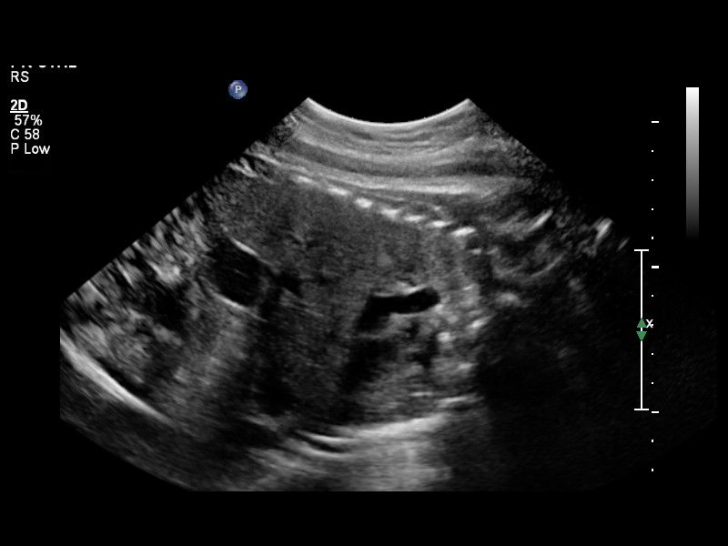

[14 of 15 positions shown; findings below may reference images not displayed]

LIMITED OBSTETRIC ULTRASOUND

Number of Fetuses: 1
Heart Rate: 785bpm
Movement: Observed
Breathing: Not observed
Presentation: Cephalic
Placental Location: Posterior above the cervical os.
Previa: None.
Amniotic Fluid (Subjective): Within normal limits

Vertical Pocket 6.4 cm     AFI 19.1 cm  (77th percentile)
IMPRESSION: 1.  The volume of amniotic fluid is within normal limits.
2.  Biophysical profile score [DATE] (fetal breathing was not
observed).

## 2013-09-04 ENCOUNTER — Other Ambulatory Visit: Payer: Self-pay | Admitting: *Deleted

## 2013-09-04 MED ORDER — METHYLDOPA 500 MG PO TABS: 500.0000 mg | ORAL_TABLET | Freq: Two times a day (BID) | ORAL | Status: DC

## 2013-09-26 ENCOUNTER — Other Ambulatory Visit: Payer: Self-pay | Admitting: *Deleted

## 2013-09-26 ENCOUNTER — Telehealth: Payer: Self-pay | Admitting: *Deleted

## 2013-09-26 DIAGNOSIS — D649 Anemia, unspecified: Secondary | ICD-10-CM

## 2013-09-26 DIAGNOSIS — Z0000 Encounter for general adult medical examination without abnormal findings: Secondary | ICD-10-CM

## 2013-09-26 NOTE — Telephone Encounter (Signed)
Katherine [name]

## 2013-09-26 NOTE — Telephone Encounter (Signed)
Called and informed pt that orders have been placed and faxed.Loralee PacasBarkley, [place] Freshour ReidlandLynetta

## 2013-09-27 ENCOUNTER — Ambulatory Visit: Payer: Managed Care, Other (non HMO) | Admitting: Family Medicine

## 2013-10-01 ENCOUNTER — Other Ambulatory Visit: Payer: Self-pay | Admitting: Family Medicine

## 2013-10-01 LAB — CBC WITH DIFFERENTIAL/PLATELET
Basophils Absolute: 0 10*3/uL (ref 0.0–0.1)
Basophils Relative: 0 % (ref 0–1)
EOS ABS: 0.1 10*3/uL (ref 0.0–0.7)
Eosinophils Relative: 2 % (ref 0–5)
HCT: 33.1 % — ABNORMAL LOW (ref 36.0–46.0)
Hemoglobin: 10.8 g/dL — ABNORMAL LOW (ref 12.0–15.0)
Lymphocytes Relative: 29 % (ref 12–46)
Lymphs Abs: 1.5 10*3/uL (ref 0.7–4.0)
MCH: 26.2 pg (ref 26.0–34.0)
MCHC: 32.6 g/dL (ref 30.0–36.0)
MCV: 80.3 fL (ref 78.0–100.0)
Monocytes Absolute: 0.3 10*3/uL (ref 0.1–1.0)
Monocytes Relative: 6 % (ref 3–12)
NEUTROS PCT: 63 % (ref 43–77)
Neutro Abs: 3.3 10*3/uL (ref 1.7–7.7)
Platelets: 324 10*3/uL (ref 150–400)
RBC: 4.12 MIL/uL (ref 3.87–5.11)
RDW: 13.9 % (ref 11.5–15.5)
WBC: 5.3 10*3/uL (ref 4.0–10.5)

## 2013-10-02 ENCOUNTER — Other Ambulatory Visit: Payer: Self-pay

## 2013-10-02 LAB — LIPID PANEL
CHOL/HDL RATIO: 2.8 ratio
CHOLESTEROL: 148 mg/dL (ref 0–200)
HDL: 53 mg/dL (ref >39)
LDL CALC: 84 mg/dL (ref 0–99)
Triglycerides: 54 mg/dL (ref <150)
VLDL: 11 mg/dL (ref 0–40)

## 2013-10-02 LAB — COMPLETE METABOLIC PANEL WITH GFR
ALT: 9 U/L (ref 0–35)
AST: 13 U/L (ref 0–37)
Albumin: 3.9 g/dL (ref 3.5–5.2)
Alkaline Phosphatase: 55 U/L (ref 39–117)
BILIRUBIN TOTAL: 0.1 mg/dL — AB (ref 0.2–1.2)
BUN: 11 mg/dL (ref 6–23)
CHLORIDE: 106 meq/L (ref 96–112)
CO2: 26 mEq/L (ref 19–32)
Calcium: 9.3 mg/dL (ref 8.4–10.5)
Creat: 0.7 mg/dL (ref 0.50–1.10)
GFR, Est African American: >89 mL/min
GFR, Est Non African American: >89 mL/min
Glucose, Bld: 78 mg/dL (ref 70–99)
Potassium: 4.4 mEq/L (ref 3.5–5.3)
Sodium: 140 mEq/L (ref 135–145)
Total Protein: 7 g/dL (ref 6.0–8.3)

## 2013-10-02 LAB — TSH: TSH: 0.996 u[IU]/mL (ref 0.350–4.500)

## 2013-10-03 ENCOUNTER — Encounter: Payer: Managed Care, Other (non HMO) | Admitting: Family Medicine

## 2013-10-03 LAB — FERRITIN: FERRITIN: 28 ng/mL (ref 10–291)

## 2013-10-04 ENCOUNTER — Other Ambulatory Visit: Payer: Self-pay | Admitting: *Deleted

## 2013-10-04 DIAGNOSIS — R790 Abnormal level of blood mineral: Secondary | ICD-10-CM

## 2013-10-29 ENCOUNTER — Ambulatory Visit (INDEPENDENT_AMBULATORY_CARE_PROVIDER_SITE_OTHER): Payer: Managed Care, Other (non HMO) | Admitting: Family Medicine

## 2013-10-29 ENCOUNTER — Encounter: Payer: Self-pay | Admitting: Family Medicine

## 2013-10-29 VITALS — BP 114/76 | HR 76 | Temp 97.9°F | Ht 64.0 in | Wt 184.0 lb

## 2013-10-29 DIAGNOSIS — I10 Essential (primary) hypertension: Secondary | ICD-10-CM

## 2013-10-29 DIAGNOSIS — Z0000 Encounter for general adult medical examination without abnormal findings: Secondary | ICD-10-CM

## 2013-10-29 DIAGNOSIS — D509 Iron deficiency anemia, unspecified: Secondary | ICD-10-CM

## 2013-10-29 DIAGNOSIS — E669 Obesity, unspecified: Secondary | ICD-10-CM | POA: Insufficient documentation

## 2013-10-29 MED ORDER — FUSION PLUS PO CAPS: 1.0000 | ORAL_CAPSULE | Freq: Every day | ORAL | Status: DC

## 2013-10-29 MED ORDER — FERROUS SULFATE 325 (65 FE) MG PO TABS: 325.0000 mg | ORAL_TABLET | Freq: Every day | ORAL | Status: DC

## 2013-10-29 NOTE — Patient Instructions (Signed)
Keep up a regular exercise program and make sure you are eating a healthy diet Try to eat 4 servings of dairy a day, or if you are lactose intolerant take a calcium with vitamin D daily.  Your vaccines are up to date.  Go to the lab in about 6 weeks to recheck your ferritin level to see if you're absorbing iron appropriately.

## 2013-10-29 NOTE — Progress Notes (Signed)
Subjective:     Katherine [name] is a 33 y.o. female and is here for a comprehensive physical exam. The patient reports problems - still having some left sided abdominal pain.  Had an US done of hte ovaries that was normal.  will stll get it a few times a month.  Her ob thought it could be IBS. [name].  Has had her pap smear with OB/GYN.  Has had labs done as well.    History   Social History  . Marital Status: Married    Spouse Name: Landry Mellowerrell    Number of Children: 2  . Years of Education: N/A   Occupational History  . RN     Care Management   Social History Main Topics  . Smoking status: Never Smoker   . Smokeless tobacco: Not on file  . Alcohol Use: No  . Drug Use: No  . Sexual Activity: Yes    Partners: Male     Comment: [employer] [facility], married, regularly exercises.   Other Topics Concern  . Not on file   Social History Narrative   1 caffeine drink per day. No regular exercise.   Health Maintenance  Topic Date Due  . Influenza Vaccine  03/22/2014  . Pap Smear  03/23/2015  . Tetanus/tdap  05/17/2020    The following portions of the patient's history were reviewed and updated as appropriate: allergies, current medications, past family history, past medical history, past social history, past surgical history and problem list.  Review of Systems A comprehensive review of systems was negative.   Objective:    BP 114/76  Pulse 76  Temp(Src) 97.9 F (36.6 C)  Ht 5\' 4"  (1.626 m)  Wt 184 lb (83.462 kg)  BMI 31.57 kg/m2  SpO2 98% General appearance: alert, cooperative and appears stated age Head: Normocephalic, without obvious abnormality, atraumatic Eyes: conj clear, EOMik PEERLA Ears: normal TM's and external ear canals both ears Nose: Nares normal. Septum midline. Mucosa normal. No drainage or sinus tenderness. Throat: lips, mucosa, and tongue normal; teeth and gums normal Neck: no adenopathy, no carotid bruit, no JVD, supple, symmetrical, trachea midline and thyroid not  enlarged, symmetric, no tenderness/mass/nodules Back: symmetric, no curvature. ROM normal. No CVA tenderness. Lungs: clear to auscultation bilaterally Heart: regular rate and rhythm, S1, S2 normal, no murmur, click, rub or gallop Abdomen: soft, non-tender; bowel sounds normal; no masses,  no organomegaly Extremities: extremities normal, atraumatic, no cyanosis or edema Pulses: 2+ and symmetric Skin: Skin color, texture, turgor normal. No rashes or lesions Lymph nodes: Cervical, supraclavicular, and axillary nodes normal. Neurologic: Alert and oriented X 3, normal strength and tone. Normal symmetric reflexes. Normal coordination and gait     Assessment:    Healthy female exam.     Plan:     See After Visit Summary for Counseling Recommendations  Keep up a regular exercise program and make sure you are eating a healthy diet Try to eat 4 servings of dairy a day, or if you are lactose intolerant take a calcium with vitamin D daily.  Your vaccines are up to date.   Iron deficiency-we'll repeat ferritin when she's on iron for 6 weeks. She says she started a couple weeks ago. The complete form for wellness screening for work. Make sure take this. Her weekend since it does seem to be constipating for her. Lab slip given to recheck ferritin. Given samples of Fusion Plus, Rx iron to try.   HTn- well-controlled on current regimen. Continue. Followup in  6 months.

## 2013-11-20 ENCOUNTER — Encounter: Payer: Self-pay | Admitting: Physician Assistant

## 2013-11-20 ENCOUNTER — Ambulatory Visit (INDEPENDENT_AMBULATORY_CARE_PROVIDER_SITE_OTHER): Payer: Managed Care, Other (non HMO) | Admitting: Physician Assistant

## 2013-11-20 VITALS — BP 124/63 | HR 104 | Ht 64.0 in | Wt 190.0 lb

## 2013-11-20 DIAGNOSIS — L0291 Cutaneous abscess, unspecified: Secondary | ICD-10-CM

## 2013-11-20 DIAGNOSIS — L0390 Cellulitis, unspecified: Principal | ICD-10-CM

## 2013-11-20 MED ORDER — MUPIROCIN 2 % EX OINT: TOPICAL_OINTMENT | CUTANEOUS | Status: DC

## 2013-11-20 MED ORDER — DOXYCYCLINE HYCLATE 100 MG PO TABS: 100.0000 mg | ORAL_TABLET | Freq: Two times a day (BID) | ORAL | Status: DC

## 2013-11-20 NOTE — Progress Notes (Signed)
   Subjective:    Patient ID: Katherine [name], female    DOB: 10/06/1980, 33 y.o.   MRN: [id number]  HPI Patient is a 33 year old female who presents to the clinic with left leg swelling, pain, drainage. She saw a bump on her left leg about 4 days ago. It has continued to get bigger with more swelling. This morning her husband started to mash in a lot of of yellow discharge came out. Today it has hurt to walk on it. She denies any fever, chills, nausea or vomiting. She did have MRSA in 2013.    Review of Systems     Objective:   Physical Exam  Skin:             Assessment & Plan:  Cellulitis/abscess-it is currently draining. I did Have Nurse place Bactroban on abscess and cover it with gauze. Started doxycycline for MRSA infection and cellulitis for 10 days. Discussed with patient to continue to use dial soap for antibacterial properties. Use ice and elevation to decrease swelling and NSAIDs for pain. Followup if not improving. If worsening signs of infection or drainage please followup in urgent care or emergency room. I did send over some Bactroban ointment and discussed treating family for nasal colonization.

## 2013-11-20 NOTE — Patient Instructions (Signed)
Cellulitis Cellulitis is an infection of the skin and the tissue beneath it. The infected area is usually red and tender. Cellulitis occurs most often in the arms and lower legs.  CAUSES  Cellulitis is caused by bacteria that enter the skin through cracks or cuts in the skin. The most common types of bacteria that cause cellulitis are Staphylococcus and Streptococcus. SYMPTOMS   Redness and warmth.  Swelling.  Tenderness or pain.  Fever. DIAGNOSIS  Your caregiver can usually determine what is wrong based on a physical exam. Blood tests may also be done. TREATMENT  Treatment usually involves taking an antibiotic medicine. HOME CARE INSTRUCTIONS   Take your antibiotics as directed. Finish them even if you start to feel better.  Keep the infected arm or leg elevated to reduce swelling.  Apply a warm cloth to the affected area up to 4 times per day to relieve pain.  Only take over-the-counter or prescription medicines for pain, discomfort, or fever as directed by your caregiver.  Keep all follow-up appointments as directed by your caregiver. SEEK MEDICAL CARE IF:   You notice red streaks coming from the infected area.  Your red area gets larger or turns dark in color.  Your bone or joint underneath the infected area becomes painful after the skin has healed.  Your infection returns in the same area or another area.  You notice a swollen bump in the infected area.  You develop new symptoms. SEEK IMMEDIATE MEDICAL CARE IF:   You have a fever.  You feel very sleepy.  You develop vomiting or diarrhea.  You have a general ill feeling (malaise) with muscle aches and pains. MAKE SURE YOU:   Understand these instructions.  Will watch your condition.  Will get help right away if you are not doing well or get worse. Document Released: 05/18/2005 Document Revised: 02/07/2012 Document Reviewed: 10/24/2011 ExitCare Patient Information 2014 [employer].  

## 2014-02-14 ENCOUNTER — Ambulatory Visit (INDEPENDENT_AMBULATORY_CARE_PROVIDER_SITE_OTHER): Payer: Managed Care, Other (non HMO) | Admitting: Family Medicine

## 2014-02-14 ENCOUNTER — Encounter: Payer: Self-pay | Admitting: Family Medicine

## 2014-02-14 VITALS — BP 137/84 | HR 66 | Ht 64.0 in

## 2014-02-14 DIAGNOSIS — M545 Low back pain, unspecified: Secondary | ICD-10-CM

## 2014-02-14 DIAGNOSIS — K5900 Constipation, unspecified: Secondary | ICD-10-CM

## 2014-02-14 LAB — POCT URINALYSIS DIPSTICK
BILIRUBIN UA: NEGATIVE
Glucose, UA: NEGATIVE
Ketones, UA: NEGATIVE
NITRITE UA: NEGATIVE
PH UA: 5.5
Protein, UA: NEGATIVE
RBC UA: NEGATIVE
Urobilinogen, UA: 0.2

## 2014-02-14 NOTE — Patient Instructions (Signed)
Polyethylene Glycol powder What is this medicine? POLYETHYLENE GLYCOL 3350 (pol ee ETH i leen; GLYE col) powder is a laxative used to treat constipation. It increases the amount of water in the stool. Bowel movements become easier and more frequent. This medicine may be used for other purposes; ask your health care provider or pharmacist if you have questions. COMMON BRAND NAME(S): GaviLax, GlycoLax, MiraLax, [facility] [facility] What should I tell my health care provider before I take this medicine? They need to know if you have any of these conditions: -a history of blockage of the stomach or intestine -current abdomen distension or pain -difficulty swallowing -diverticulitis, ulcerative colitis, or other chronic bowel disease -phenylketonuria -an unusual or allergic reaction to polyethylene glycol, other medicines, dyes, or preservatives -pregnant or trying to get pregnant -breast-feeding How should I use this medicine? Take this medicine by mouth. The bottle has a measuring cap that is marked with a line. Pour the powder into the cap up to the marked line (the dose is about 1 heaping tablespoon). Add the powder in the cap to a full glass (4 to 8 ounces or 120 to 240 ml) of water, juice, soda, coffee or tea. Mix the powder well. Drink the solution. Take exactly as directed. Do not take your medicine more often than directed. Talk to your pediatrician regarding the use of this medicine in children. Special care may be needed. Overdosage: If you think you have taken too much of this medicine contact a poison control center or emergency room at once. NOTE: This medicine is only for you. Do not share this medicine with others. What if I miss a dose? If you miss a dose, take it as soon as you can. If it is almost time for your next dose, take only that dose. Do not take double or extra doses. What may interact with this medicine? Interactions are not expected. This list may not describe all possible  interactions. Give your health care provider a list of all the medicines, herbs, non-prescription drugs, or dietary supplements you use. Also tell them if you smoke, drink alcohol, or use illegal drugs. Some items may interact with your medicine. What should I watch for while using this medicine? Do not use for more than 2 weeks without advice from your doctor or health care professional. It can take 2 to 4 days to have a bowel movement and to experience improvement in constipation. See your health care professional for any changes in bowel habits, including constipation, that are severe or last longer than three weeks. Always take this medicine with plenty of water. What side effects may I notice from receiving this medicine? Side effects that you should report to your doctor or health care professional as soon as possible: -diarrhea -difficulty breathing -itching of the skin, hives, or skin rash -severe bloating, pain, or distension of the stomach -vomiting Side effects that usually do not require medical attention (report to your doctor or health care professional if they continue or are bothersome): -bloating or gas -lower abdominal discomfort or cramps -nausea This list may not describe all possible side effects. Call your doctor for medical advice about side effects. You may report side effects to FDA at 1-800-FDA-1088. Where should I keep my medicine? Keep out of the reach of children. Store between 15 and 30 degrees C (59 and 86 degrees F). Throw away any unused medicine after the expiration date. NOTE: This sheet is a summary. It may not cover all possible information. If   you have questions about this medicine, talk to your doctor, pharmacist, or health care provider.  2015, Elsevier/Gold Standard. (2008-03-10 16:50:45)  

## 2014-02-14 NOTE — Progress Notes (Signed)
   Subjective:    Patient ID: Katherine [name], female    DOB: 12/18/1980, 33 y.o.   MRN: [id number]  HPI  2 weeks of low back and lower abdominal pain.  She says is bilateal.  She has noticed some dark urine but has increase her water intake. She does have constipation.  She is on benefiber.  No blood in the stool. Some intermittant nausea.  Pain has been crampy.  Last period was in early June. Last gyn exam was a year ago and had US at that time for Left sided abdominal pain and it was normal.   Review of Systems No hematuria.    Objective:   Physical Exam  Constitutional: She is oriented to person, place, and time. She appears well-developed and well-nourished.  HENT:  Head: Normocephalic and atraumatic.  Abdominal: Soft. Bowel sounds are normal. She exhibits no distension and no mass. There is tenderness. There is no rebound and no guarding.  TTP in the LUQ and LLQ   Musculoskeletal:  No CVA tenderness   Neurological: She is alert and oriented to person, place, and time.  Skin: Skin is warm and dry.  Psychiatric: She has a normal mood and affect. Her behavior is normal.          Assessment & Plan:  Lower abdominal pain-urinalysis is negative for acute cystitis we will send for culture as she has recently increased her fluids. I most suspicious of constipation. She's tender on exam mostly on the left side which is where she gets recurrent tenderness with her constipation. We discussed a trial of MiraLax to help move her bowels until she has a soft stool. She also needs to get back on her Benefiber daily which does help her with consistencies of her stool. If she gets any nausea vomiting or temperature or fevers she is to call back immediately. No other warning signs or symptoms.

## 2014-02-19 LAB — URINE CULTURE

## 2014-03-13 ENCOUNTER — Encounter: Payer: Self-pay | Admitting: Family Medicine

## 2014-03-21 ENCOUNTER — Ambulatory Visit: Payer: Self-pay | Admitting: Family Medicine

## 2014-06-23 ENCOUNTER — Encounter: Payer: Self-pay | Admitting: Family Medicine

## 2014-07-16 ENCOUNTER — Other Ambulatory Visit: Payer: Self-pay | Admitting: Obstetrics and Gynecology

## 2014-07-21 LAB — CYTOLOGY - PAP

## 2014-07-25 ENCOUNTER — Ambulatory Visit: Payer: Managed Care, Other (non HMO) | Admitting: Family Medicine

## 2014-07-30 ENCOUNTER — Ambulatory Visit (INDEPENDENT_AMBULATORY_CARE_PROVIDER_SITE_OTHER): Payer: Managed Care, Other (non HMO) | Admitting: Family Medicine

## 2014-07-30 ENCOUNTER — Encounter: Payer: Self-pay | Admitting: Family Medicine

## 2014-07-30 VITALS — BP 116/69 | HR 79 | Temp 98.1°F | Ht 64.0 in | Wt 191.0 lb

## 2014-07-30 DIAGNOSIS — M545 Low back pain, unspecified: Secondary | ICD-10-CM

## 2014-07-30 DIAGNOSIS — K2100 Gastro-esophageal reflux disease with esophagitis, without bleeding: Secondary | ICD-10-CM

## 2014-07-30 DIAGNOSIS — D509 Iron deficiency anemia, unspecified: Secondary | ICD-10-CM

## 2014-07-30 MED ORDER — MELOXICAM 15 MG PO TABS: 15.0000 mg | ORAL_TABLET | Freq: Every day | ORAL | Status: DC

## 2014-07-30 MED ORDER — CYCLOBENZAPRINE HCL 10 MG PO TABS: 5.0000 mg | ORAL_TABLET | Freq: Every evening | ORAL | Status: DC | PRN

## 2014-07-30 MED ORDER — OMEPRAZOLE 40 MG PO CPDR: 40.0000 mg | DELAYED_RELEASE_CAPSULE | Freq: Every day | ORAL | Status: DC

## 2014-07-30 NOTE — Progress Notes (Signed)
   Subjective:    Patient ID: Katherine [name], female    DOB: 11/20/1980, 33 y.o.   MRN: [id number]  HPI 3 weeks of low back pain. Rates 4/10.  Comes and goes.  managable but a dull achey pain. No dysuria or fever or chills. No flank pain.  Occ uses IBU. Says it feels tight.  Heat and massage some - that helps.  She work in the OR for 8 years.  No old injuries.  Feel better when she bends forward.  No radicular sxs.    3 weeks of reflux sxs. Having heartburn.  Says not really eating any spicey foods. Starts in AM.  Says feels like her food gets stuck in her throat. Using Mylanta and that does help her sxs. Had it more severe during her pregnancy.   She sometimes feels like her food just sits in her stomach.   Review of Systems     Objective:   Physical Exam  Constitutional: She is oriented to person, place, and time. She appears well-developed and well-nourished.  HENT:  Head: Normocephalic and atraumatic.  Mouth/Throat: Oropharynx is clear and moist.  Cardiovascular: Normal rate, regular rhythm and normal heart sounds.   Pulmonary/Chest: Effort normal and breath sounds normal.  Musculoskeletal:  Non tender thoracic and lumbar spine.  Normal flexion, oxygen, rotation right left and side bending. Hip, knee, ankle strength is 5 out of 5. Patellar reflexes 2+ bilaterally. No swelling or nodules.  Neurological: She is alert and oriented to person, place, and time.  Skin: Skin is warm and dry. There is erythema.  Psychiatric: She has a normal mood and affect. Her behavior is normal.          Assessment & Plan:  Low back pain - Likely musclar.  No other systemic or renal sxs.  Will tx with NSAID, Muscle relaxer, and stretches. Offered to put her in formal PT but declined at this time.  F/ U in 3 weeks if not better.    GERD with esophagitis - will start PPI. H.O provided. Reviewed foods to avoid.  If still having difficulty with food feeling like it's getting stuck after couple of weeks of  PPI therapy then encouraged her to call the office back so that we can refer her to gastroneurology for further evaluation for possible stricture.

## 2014-07-30 NOTE — Patient Instructions (Addendum)
Start home exercise.  Take the anti-inflammatory  For one week with food and water. Stop immediately if upsetting your stomach.  If not improving in the next 3 weeks then please come back in. Food Choices for Gastroesophageal Reflux Disease When you have gastroesophageal reflux disease (GERD), the foods you eat and your eating habits are very important. Choosing the right foods can help ease your discomfort.  WHAT GUIDELINES DO I NEED TO FOLLOW?   Choose fruits, vegetables, whole grains, and low-fat dairy products.   Choose low-fat meat, fish, and poultry.  Limit fats such as oils, salad dressings, butter, nuts, and avocado.   Keep a food diary. This helps you identify foods that cause symptoms.   Avoid foods that cause symptoms. These may be different for everyone.   Eat small meals often instead of 3 large meals a day.   Eat your meals slowly, in a place where you are relaxed.   Limit fried foods.   Cook foods using methods other than frying.   Avoid drinking alcohol.   Avoid drinking large amounts of liquids with your meals.   Avoid bending over or lying down until 2-3 hours after eating.  WHAT FOODS ARE NOT RECOMMENDED?  These are some foods and drinks that may make your symptoms worse: Vegetables Tomatoes. Tomato juice. Tomato and spaghetti sauce. Chili peppers. Onion and garlic. Horseradish. Fruits Oranges, grapefruit, and lemon (fruit and juice). Meats High-fat meats, fish, and poultry. This includes hot dogs, ribs, ham, sausage, salami, and bacon. Dairy Whole milk and chocolate milk. Sour cream. Cream. Butter. Ice cream. Cream cheese.  Drinks Coffee and tea. Bubbly (carbonated) drinks or energy drinks. Condiments Hot sauce. Barbecue sauce.  Sweets/Desserts Chocolate and cocoa. Donuts. Peppermint and spearmint. Fats and Oils High-fat foods. This includes [place] fries and potato chips. Other Vinegar. Strong spices. This includes black pepper, white  pepper, red pepper, cayenne, curry powder, cloves, ginger, and chili powder. The items listed above may not be a complete list of foods and drinks to avoid. Contact your dietitian for more information. Document Released: 02/07/2012 Document Revised: 08/13/2013 Document Reviewed: 06/12/2013 [facility] Patient Information 2015 [employer], [place]. This information is not intended to replace advice given to you by your health care provider. Make sure you discuss any questions you have with your health care provider.

## 2014-07-31 ENCOUNTER — Other Ambulatory Visit: Payer: Self-pay | Admitting: Obstetrics and Gynecology

## 2014-07-31 DIAGNOSIS — N6459 Other signs and symptoms in breast: Secondary | ICD-10-CM

## 2014-08-13 ENCOUNTER — Other Ambulatory Visit: Payer: Managed Care, Other (non HMO)

## 2014-10-13 ENCOUNTER — Encounter: Payer: Self-pay | Admitting: Family Medicine

## 2014-10-13 ENCOUNTER — Ambulatory Visit (INDEPENDENT_AMBULATORY_CARE_PROVIDER_SITE_OTHER): Payer: Managed Care, Other (non HMO) | Admitting: Family Medicine

## 2014-10-13 VITALS — BP 121/75 | HR 85 | Wt 197.0 lb

## 2014-10-13 DIAGNOSIS — K219 Gastro-esophageal reflux disease without esophagitis: Secondary | ICD-10-CM

## 2014-10-13 DIAGNOSIS — R053 Chronic cough: Secondary | ICD-10-CM

## 2014-10-13 DIAGNOSIS — R05 Cough: Secondary | ICD-10-CM

## 2014-10-13 DIAGNOSIS — M545 Low back pain: Secondary | ICD-10-CM

## 2014-10-13 NOTE — Progress Notes (Signed)
   Subjective:    Patient ID: Katherine [name], female    DOB: 10/21/1980, 34 y.o.   MRN: [id number]  HPI Cough for about a months.  Start with URI sxs.  [facility] and started on flonase x 5 days and says not really helping. Feels like her lungs are congested.  No nasal congestion or sneezing. Getting a lot of post nasal drip. She is getting some clear and green mucous.  No wheezing.  No SOB. Gets a tickle in the back of the throat. No fever.  No ear pain.    Reflux did well on the PPI, but stopped it when started doing well. Off of meds now. Has been having more reflux sxs lately.   No longer getting dysphagia.   Her back pain gradually got better as well. He did start to flare back up so she went to urgent care and was told that her bowels were full of stool. She started taking something to help get her bowels moving and that seems to have improved her symptoms as well.  Review of Systems     Objective:   Physical Exam  Constitutional: She is oriented to person, place, and time. She appears well-developed and well-nourished.  HENT:  Head: Normocephalic and atraumatic.  Right Ear: External ear normal.  Left Ear: External ear normal.  Nose: Nose normal.  Mouth/Throat: Oropharynx is clear and moist.  TMs and canals are clear.   Eyes: Conjunctivae and EOM are normal. Pupils are equal, round, and reactive to light.  Neck: Neck supple. No thyromegaly present.  Cardiovascular: Normal rate, regular rhythm and normal heart sounds.   Pulmonary/Chest: Effort normal and breath sounds normal. She has no wheezes.  Lymphadenopathy:    She has no cervical adenopathy.  Neurological: She is alert and oriented to person, place, and time.  Skin: Skin is warm and dry.  Psychiatric: She has a normal mood and affect.          Assessment & Plan:  Chronic cough-recommend restart the reflux medication. Could also be secondary to recent infection. Sounds consistent with postinfectious  cough. In which case it should resolve over the next 2-3 weeks. I think restarting the reflux medication is certainly worth a try for one to 2 weeks. If at that point she's not better or certainly if she gets worse then recommend chest x-ray for further evaluation.  GERD-she has had some recurrence in symptoms recently. Make sure getting back on track with dietary measures in addition to restarting her PPI for. A time. Next  Low back pain-her back is doing better with treatment of her constipation.

## 2014-10-13 NOTE — Patient Instructions (Signed)
Can go for chest xray if cough is not at least 50% better in one week on the reflux medicine.

## 2014-10-23 ENCOUNTER — Ambulatory Visit (INDEPENDENT_AMBULATORY_CARE_PROVIDER_SITE_OTHER): Payer: Managed Care, Other (non HMO)

## 2014-10-23 DIAGNOSIS — R05 Cough: Secondary | ICD-10-CM

## 2014-10-23 DIAGNOSIS — R053 Chronic cough: Secondary | ICD-10-CM

## 2014-10-27 MED ORDER — AMOXICILLIN-POT CLAVULANATE 875-125 MG PO TABS: 1.0000 | ORAL_TABLET | Freq: Two times a day (BID) | ORAL | Status: DC

## 2014-11-06 ENCOUNTER — Other Ambulatory Visit: Payer: Self-pay | Admitting: Family Medicine

## 2014-11-06 NOTE — Telephone Encounter (Signed)
Needs f/u appt before future refills 

## 2014-11-11 ENCOUNTER — Ambulatory Visit (INDEPENDENT_AMBULATORY_CARE_PROVIDER_SITE_OTHER): Payer: Managed Care, Other (non HMO) | Admitting: Family Medicine

## 2014-11-11 ENCOUNTER — Encounter: Payer: Self-pay | Admitting: Family Medicine

## 2014-11-11 VITALS — BP 139/78 | HR 84 | Ht 64.0 in | Wt 191.0 lb

## 2014-11-11 DIAGNOSIS — Z13220 Encounter for screening for lipoid disorders: Secondary | ICD-10-CM | POA: Diagnosis not present

## 2014-11-11 DIAGNOSIS — I10 Essential (primary) hypertension: Secondary | ICD-10-CM | POA: Diagnosis not present

## 2014-11-11 DIAGNOSIS — K5909 Other constipation: Secondary | ICD-10-CM

## 2014-11-11 DIAGNOSIS — Z0000 Encounter for general adult medical examination without abnormal findings: Secondary | ICD-10-CM | POA: Diagnosis not present

## 2014-11-11 DIAGNOSIS — D509 Iron deficiency anemia, unspecified: Secondary | ICD-10-CM

## 2014-11-11 DIAGNOSIS — K5900 Constipation, unspecified: Secondary | ICD-10-CM

## 2014-11-11 NOTE — Patient Instructions (Signed)
Keep up a regular exercise program and make sure you are eating a healthy diet Try to eat 4 servings of dairy a day, or if you are lactose intolerant take a calcium with vitamin D daily.  Your vaccines are up to date.   

## 2014-11-11 NOTE — Progress Notes (Signed)
Subjective:    Patient ID: Katherine [name], female    DOB: 08/13/1981, 34 y.o.   MRN: [id number]  HPI    Review of Systems     Objective:   Physical Exam        Assessment & Plan:    Subjective:     Katherine [name] is a 34 y.o. female and is here for a comprehensive physical exam. The patient reports no problems. Has been trying to exercise.   Hypertension- Pt denies chest pain, SOB, dizziness, or heart palpitations.  Taking meds as directed w/o problems.  Denies medication side effects.      History   Social History  . Marital Status: Married    Spouse Name: Landry Mellowerrell  . Number of Children: 2  . Years of Education: N/A   Occupational History  . RN     Care Management   Social History Main Topics  . Smoking status: Never Smoker   . Smokeless tobacco: Not on file  . Alcohol Use: No  . Drug Use: No  . Sexual Activity:    Partners: Male     Comment: [employer] [facility], married, regularly exercises.   Other Topics Concern  . Not on file   Social History Narrative   1 caffeine drink per day. No regular exercise.   Health Maintenance  Topic Date Due  . INFLUENZA VACCINE  03/23/2015  . PAP SMEAR  07/16/2017  . TETANUS/TDAP  05/17/2020  . HIV Screening  Completed     The following portions of the patient's history were reviewed and updated as appropriate: allergies, current medications, past family history, past medical history, past social history, past surgical history and problem list.  Review of Systems A comprehensive review of systems was negative.   Objective:    BP 139/78 mmHg  Pulse 84  Ht 5\' 4"  (1.626 m)  Wt 191 lb (86.637 kg)  BMI 32.77 kg/m2  SpO2 98%  LMP 09/27/2014 General appearance: alert, cooperative and appears stated age Head: Normocephalic, without obvious abnormality, atraumatic Eyes: conj clear, EOMi, PEERLA Ears: normal TM's and external ear canals both ears Nose: Nares normal. Septum midline. Mucosa normal. No drainage or sinus  tenderness. Throat: lips, mucosa, and tongue normal; teeth and gums normal Neck: no adenopathy, no carotid bruit, no JVD, supple, symmetrical, trachea midline and thyroid not enlarged, symmetric, no tenderness/mass/nodules Back: symmetric, no curvature. ROM normal. No CVA tenderness. Lungs: clear to auscultation bilaterally Heart: regular rate and rhythm, S1, S2 normal, no murmur, click, rub or gallop Abdomen: soft, non-tender; bowel sounds normal; no masses,  no organomegaly Extremities: extremities normal, atraumatic, no cyanosis or edema Pulses: 2+ and symmetric Skin: Skin color, texture, turgor normal. No rashes or lesions Lymph nodes: Cervical, supraclavicular, and axillary nodes normal. Neurologic: Alert and oriented X 3, normal strength and tone. Normal symmetric reflexes. Normal coordination and gait    Assessment:    Healthy female exam.      Plan:     See After Visit Summary for Counseling Recommendations  Keep up a regular exercise program and make sure you are eating a healthy diet Try to eat 4 servings of dairy a day, or if you are lactose intolerant take a calcium with vitamin D daily.  Your vaccines are up to date.   HTN - she is planning on becoming pregnant agin. She is doing well on the methyldopa.  Home Bps have been well controlled.    Constipation - discussed options such at [employer] or  [employer] or working  With the [employer].  Encouraged her to do a cleanout initially and then start MiraLAX daily. If it causes loose stools then can decrease to every other day or even just 3 days per week. Certainly if she would like to try one upper scription medication she can call the office back.

## 2014-11-20 LAB — COMPLETE METABOLIC PANEL WITH GFR
ALK PHOS: 47 U/L (ref 39–117)
ALT: 9 U/L (ref 0–35)
AST: 15 U/L (ref 0–37)
Albumin: 4.4 g/dL (ref 3.5–5.2)
BILIRUBIN TOTAL: 0.3 mg/dL (ref 0.2–1.2)
BUN: 10 mg/dL (ref 6–23)
CO2: 27 mEq/L (ref 19–32)
CREATININE: 0.75 mg/dL (ref 0.50–1.10)
Calcium: 9.1 mg/dL (ref 8.4–10.5)
Chloride: 106 mEq/L (ref 96–112)
GFR, Est African American: >89 mL/min
GFR, Est Non African American: >89 mL/min
GLUCOSE: 81 mg/dL (ref 70–99)
POTASSIUM: 4.1 meq/L (ref 3.5–5.3)
Sodium: 140 mEq/L (ref 135–145)
Total Protein: 7.5 g/dL (ref 6.0–8.3)

## 2014-11-20 LAB — CBC
HCT: 35.3 % — ABNORMAL LOW (ref 36.0–46.0)
Hemoglobin: 11.3 g/dL — ABNORMAL LOW (ref 12.0–15.0)
MCH: 26 pg (ref 26.0–34.0)
MCHC: 32 g/dL (ref 30.0–36.0)
MCV: 81.1 fL (ref 78.0–100.0)
MPV: 10.1 fL (ref 8.6–12.4)
PLATELETS: 244 10*3/uL (ref 150–400)
RBC: 4.35 MIL/uL (ref 3.87–5.11)
RDW: 14 % (ref 11.5–15.5)
WBC: 4.5 10*3/uL (ref 4.0–10.5)

## 2014-11-20 LAB — LIPID PANEL
CHOLESTEROL: 152 mg/dL (ref 0–200)
HDL: 54 mg/dL (ref >=46)
LDL CALC: 89 mg/dL (ref 0–99)
Total CHOL/HDL Ratio: 2.8 Ratio
Triglycerides: 47 mg/dL (ref <150)
VLDL: 9 mg/dL (ref 0–40)

## 2014-11-21 LAB — FERRITIN: Ferritin: 20 ng/mL (ref 10–291)

## 2014-12-16 ENCOUNTER — Encounter: Payer: Self-pay | Admitting: Family Medicine

## 2014-12-17 MED ORDER — FERRALET 90 90-1 MG PO TABS: 1.0000 | ORAL_TABLET | Freq: Every day | ORAL | Status: DC

## 2014-12-17 NOTE — Telephone Encounter (Signed)
rx sent for 90 supply.Loralee PacasBarkley, Starlet Gallentine BeaverLynetta

## 2015-01-05 ENCOUNTER — Other Ambulatory Visit: Payer: Self-pay | Admitting: Family Medicine

## 2015-01-05 ENCOUNTER — Other Ambulatory Visit: Payer: Self-pay

## 2015-01-05 MED ORDER — METHYLDOPA 500 MG PO TABS: 500.0000 mg | ORAL_TABLET | Freq: Two times a day (BID) | ORAL | Status: DC

## 2015-05-26 ENCOUNTER — Encounter: Payer: Self-pay | Admitting: Family Medicine

## 2015-05-26 ENCOUNTER — Ambulatory Visit (INDEPENDENT_AMBULATORY_CARE_PROVIDER_SITE_OTHER): Payer: Managed Care, Other (non HMO) | Admitting: Family Medicine

## 2015-05-26 VITALS — BP 113/64 | HR 75 | Ht 64.0 in | Wt 177.0 lb

## 2015-05-26 DIAGNOSIS — I10 Essential (primary) hypertension: Secondary | ICD-10-CM

## 2015-05-26 DIAGNOSIS — D509 Iron deficiency anemia, unspecified: Secondary | ICD-10-CM

## 2015-05-26 LAB — BASIC METABOLIC PANEL
BUN: 12 mg/dL (ref 7–25)
CO2: 26 mmol/L (ref 20–31)
CREATININE: 0.79 mg/dL (ref 0.50–1.10)
Calcium: 9.3 mg/dL (ref 8.6–10.2)
Chloride: 105 mmol/L (ref 98–110)
Glucose, Bld: 71 mg/dL (ref 65–99)
Potassium: 3.9 mmol/L (ref 3.5–5.3)
Sodium: 138 mmol/L (ref 135–146)

## 2015-05-26 LAB — CBC WITH DIFFERENTIAL/PLATELET
BASOS ABS: 0 10*3/uL (ref 0.0–0.1)
Basophils Relative: 0 % (ref 0–1)
EOS ABS: 0.1 10*3/uL (ref 0.0–0.7)
EOS PCT: 1 % (ref 0–5)
HCT: 32.2 % — ABNORMAL LOW (ref 36.0–46.0)
Hemoglobin: 10.5 g/dL — ABNORMAL LOW (ref 12.0–15.0)
Lymphocytes Relative: 23 % (ref 12–46)
Lymphs Abs: 1.4 10*3/uL (ref 0.7–4.0)
MCH: 25.9 pg — ABNORMAL LOW (ref 26.0–34.0)
MCHC: 32.6 g/dL (ref 30.0–36.0)
MCV: 79.5 fL (ref 78.0–100.0)
MPV: 10.1 fL (ref 8.6–12.4)
Monocytes Absolute: 0.4 10*3/uL (ref 0.1–1.0)
Monocytes Relative: 7 % (ref 3–12)
Neutro Abs: 4.3 10*3/uL (ref 1.7–7.7)
Neutrophils Relative %: 69 % (ref 43–77)
Platelets: 270 10*3/uL (ref 150–400)
RBC: 4.05 MIL/uL (ref 3.87–5.11)
RDW: 14.2 % (ref 11.5–15.5)
WBC: 6.2 10*3/uL (ref 4.0–10.5)

## 2015-05-26 LAB — FERRITIN: Ferritin: 20 ng/mL (ref 10–291)

## 2015-05-26 MED ORDER — METHYLDOPA 250 MG PO TABS: 250.0000 mg | ORAL_TABLET | Freq: Two times a day (BID) | ORAL | Status: DC

## 2015-05-26 MED ORDER — FERRALET 90 90-1 MG PO TABS: 1.0000 | ORAL_TABLET | Freq: Every day | ORAL | Status: DC

## 2015-05-26 NOTE — Patient Instructions (Signed)
Please call me if your blood pressure start going up.

## 2015-05-26 NOTE — Progress Notes (Signed)
   Subjective:    Patient ID: Katherine [name], female    DOB: [birth year], 34 y.o.   MRN: [id number]  HPI Hypertension- Pt denies chest pain, SOB, dizziness, or heart palpitations.  Taking meds as directed w/o problems.  Denies medication side effects. She and her husband are still considering having children but not sure.    Iron def anemia - still taking an iron  Supplement.  Ferritin 6 months ago was 20. Review of Systems     Objective:   Physical Exam  Constitutional: She is oriented to person, place, and time. She appears well-developed and well-nourished.  HENT:  Head: Normocephalic and atraumatic.  Cardiovascular: Normal rate, regular rhythm and normal heart sounds.   Pulmonary/Chest: Effort normal and breath sounds normal.  Neurological: She is alert and oriented to person, place, and time.  Skin: Skin is warm and dry.  Psychiatric: She has a normal mood and affect. Her behavior is normal.          Assessment & Plan:  HTN - well controlled. She has lost 14 lbs and doing well.  We will decrease her methyldopa to 250 mg twice a day. She does have a blood pressure cuff at home and I encouraged her to monitor her pressures over the next 3 weeks and call me back if she feels like they're going to high. Follow-up in 6 months. BMP today.   iron deficiency anemia- due to recheck CBC and ferritin level. We'll call with results once available. We discussed goal of getting her ferritin greater than 40.

## 2015-11-24 ENCOUNTER — Ambulatory Visit: Payer: Managed Care, Other (non HMO) | Admitting: Family Medicine

## 2015-12-14 ENCOUNTER — Ambulatory Visit (INDEPENDENT_AMBULATORY_CARE_PROVIDER_SITE_OTHER): Payer: 59 | Admitting: Family Medicine

## 2015-12-14 ENCOUNTER — Encounter: Payer: Self-pay | Admitting: Family Medicine

## 2015-12-14 VITALS — BP 120/69 | HR 64 | Ht 64.0 in | Wt 185.0 lb

## 2015-12-14 DIAGNOSIS — D509 Iron deficiency anemia, unspecified: Secondary | ICD-10-CM

## 2015-12-14 DIAGNOSIS — I10 Essential (primary) hypertension: Secondary | ICD-10-CM

## 2015-12-14 MED ORDER — LISINOPRIL 10 MG PO TABS: 10.0000 mg | ORAL_TABLET | Freq: Every day | ORAL | Status: DC

## 2015-12-14 MED ORDER — FERRALET 90 90-1 MG PO TABS: 1.0000 | ORAL_TABLET | Freq: Every day | ORAL | Status: DC

## 2015-12-14 NOTE — Progress Notes (Signed)
   Subjective:    Patient ID: Katherine [name], female    DOB: 11/17/1980, 35 y.o.   MRN: [id number]  HPI  Hypertension- Pt denies chest pain, SOB, dizziness, or heart palpitations.  Taking meds as directed w/o problems.  Denies medication side effects.  I saw her 6 months ago we decreased her methyldopa 250 mg twice a day. She's been tolerating it well without any side effects. She says she doesn't get back on track with diet and exercise and portion control. She said she was doing really well with that for a while but has fallen off recently. Next  Iron deficiency anemia-she does need her iron refilled and is requesting a 90 day supply. She says she tolerates it well without any GI upset.  Review of Systems     Objective:   Physical Exam  Constitutional: She is oriented to person, place, and time. She appears well-developed and well-nourished.  HENT:  Head: Normocephalic and atraumatic.  Cardiovascular: Normal rate, regular rhythm and normal heart sounds.   Pulmonary/Chest: Effort normal and breath sounds normal.  Neurological: She is alert and oriented to person, place, and time.  Skin: Skin is warm and dry.  Psychiatric: She has a normal mood and affect. Her behavior is normal.          Assessment & Plan:  Hypertension - Well controlled. She and her husband have decided not to have another child at least at this point and she is wanting to switch off the methyldopa. We'll try Linzess lisinopril 10 mg daily. New perception sent to the pharmacy. She will check her blood pressures at home and give me a call next couple weeks if her blood pressure isn't at goal. Will check baseline kidney function.Also due to recheck lipids.  Iron deficiency anemia-refills sent to pharmacy. Due to recheck iron levels.She will go to the lab when she is fasting.

## 2015-12-15 ENCOUNTER — Ambulatory Visit: Payer: Self-pay | Admitting: Family Medicine

## 2016-03-14 ENCOUNTER — Ambulatory Visit: Payer: 59 | Admitting: Family Medicine

## 2016-04-15 ENCOUNTER — Encounter: Payer: Self-pay | Admitting: Family Medicine

## 2016-04-15 ENCOUNTER — Ambulatory Visit (INDEPENDENT_AMBULATORY_CARE_PROVIDER_SITE_OTHER): Payer: 59 | Admitting: Family Medicine

## 2016-04-15 VITALS — BP 121/78 | HR 78 | Ht 64.0 in | Wt 203.0 lb

## 2016-04-15 DIAGNOSIS — Z0189 Encounter for other specified special examinations: Secondary | ICD-10-CM

## 2016-04-15 DIAGNOSIS — I10 Essential (primary) hypertension: Secondary | ICD-10-CM

## 2016-04-15 DIAGNOSIS — Z0000 Encounter for general adult medical examination without abnormal findings: Secondary | ICD-10-CM

## 2016-04-15 MED ORDER — LISINOPRIL 10 MG PO TABS: 10.0000 mg | ORAL_TABLET | Freq: Every day | ORAL | 1 refills | Status: DC

## 2016-04-15 NOTE — Patient Instructions (Addendum)
Health Maintenance, Female Adopting a healthy lifestyle and getting preventive care can go a long way to promote health and wellness. Talk with your health care provider about what schedule of regular examinations is right for you. This is a good chance for you to check in with your provider about disease prevention and staying healthy. In between checkups, there are plenty of things you can do on your own. Experts have done a lot of research about which lifestyle changes and preventive measures are most likely to keep you healthy. Ask your health care provider for more information. WEIGHT AND DIET  Eat a healthy diet  Be sure to include plenty of vegetables, fruits, low-fat dairy products, and lean protein.  Do not eat a lot of foods high in solid fats, added sugars, or salt.  Get regular exercise. This is one of the most important things you can do for your health.  Most adults should exercise for at least 150 minutes each week. The exercise should increase your heart rate and make you sweat (moderate-intensity exercise).  Most adults should also do strengthening exercises at least twice a week. This is in addition to the moderate-intensity exercise.  Maintain a healthy weight  Body mass index (BMI) is a measurement that can be used to identify possible weight problems. It estimates body fat based on height and weight. Your health care provider can help determine your BMI and help you achieve or maintain a healthy weight.  For females 20 years of age and older:   A BMI below 18.5 is considered underweight.  A BMI of 18.5 to 24.9 is normal.  A BMI of 25 to 29.9 is considered overweight.  A BMI of 30 and above is considered obese.  Watch levels of cholesterol and blood lipids  You should start having your blood tested for lipids and cholesterol at 34 years of age, then have this test every 5 years.  You may need to have your cholesterol levels checked more often if:  Your lipid  or cholesterol levels are high.  You are older than 34 years of age.  You are at high risk for heart disease.  CANCER SCREENING   Lung Cancer  Lung cancer screening is recommended for adults 55-80 years old who are at high risk for lung cancer because of a history of smoking.  A yearly low-dose CT scan of the lungs is recommended for people who:  Currently smoke.  Have quit within the past 15 years.  Have at least a 30-pack-year history of smoking. A pack year is smoking an average of one pack of cigarettes a day for 1 year.  Yearly screening should continue until it has been 15 years since you quit.  Yearly screening should stop if you develop a health problem that would prevent you from having lung cancer treatment.  Breast Cancer  Practice breast self-awareness. This means understanding how your breasts normally appear and feel.  It also means doing regular breast self-exams. Let your health care provider know about any changes, no matter how small.  If you are in your 20s or 30s, you should have a clinical breast exam (CBE) by a health care provider every 1-3 years as part of a regular health exam.  If you are 40 or older, have a CBE every year. Also consider having a breast X-ray (mammogram) every year.  If you have a family history of breast cancer, talk to your health care provider about genetic screening.  If you   are at high risk for breast cancer, talk to your health care provider about having an MRI and a mammogram every year.  Breast cancer gene (BRCA) assessment is recommended for women who have family members with BRCA-related cancers. BRCA-related cancers include:  Breast.  Ovarian.  Tubal.  Peritoneal cancers.  Results of the assessment will determine the need for genetic counseling and BRCA1 and BRCA2 testing. Cervical Cancer Your health care provider may recommend that you be screened regularly for cancer of the pelvic organs (ovaries, uterus, and  vagina). This screening involves a pelvic examination, including checking for microscopic changes to the surface of your cervix (Pap test). You may be encouraged to have this screening done every 3 years, beginning at age 21.  For women ages 30-65, health care providers may recommend pelvic exams and Pap testing every 3 years, or they may recommend the Pap and pelvic exam, combined with testing for human papilloma virus (HPV), every 5 years. Some types of HPV increase your risk of cervical cancer. Testing for HPV may also be done on women of any age with unclear Pap test results.  Other health care providers may not recommend any screening for nonpregnant women who are considered low risk for pelvic cancer and who do not have symptoms. Ask your health care provider if a screening pelvic exam is right for you.  If you have had past treatment for cervical cancer or a condition that could lead to cancer, you need Pap tests and screening for cancer for at least 20 years after your treatment. If Pap tests have been discontinued, your risk factors (such as having a new sexual partner) need to be reassessed to determine if screening should resume. Some women have medical problems that increase the chance of getting cervical cancer. In these cases, your health care provider may recommend more frequent screening and Pap tests. Colorectal Cancer  This type of cancer can be detected and often prevented.  Routine colorectal cancer screening usually begins at 34 years of age and continues through 34 years of age.  Your health care provider may recommend screening at an earlier age if you have risk factors for colon cancer.  Your health care provider may also recommend using home test kits to check for hidden blood in the stool.  A small camera at the end of a tube can be used to examine your colon directly (sigmoidoscopy or colonoscopy). This is done to check for the earliest forms of colorectal  cancer.  Routine screening usually begins at age 50.  Direct examination of the colon should be repeated every 5-10 years through 34 years of age. However, you may need to be screened more often if early forms of precancerous polyps or small growths are found. Skin Cancer  Check your skin from head to toe regularly.  Tell your health care provider about any new moles or changes in moles, especially if there is a change in a mole's shape or color.  Also tell your health care provider if you have a mole that is larger than the size of a pencil eraser.  Always use sunscreen. Apply sunscreen liberally and repeatedly throughout the day.  Protect yourself by wearing long sleeves, pants, a wide-brimmed hat, and sunglasses whenever you are outside. HEART DISEASE, DIABETES, AND HIGH BLOOD PRESSURE   High blood pressure causes heart disease and increases the risk of stroke. High blood pressure is more likely to develop in:  People who have blood pressure in the high end   of the normal range (130-139/85-89 mm Hg).  People who are overweight or obese.  People who are African American.  If you are 38-23 years of age, have your blood pressure checked every 3-5 years. If you are 61 years of age or older, have your blood pressure checked every year. You should have your blood pressure measured twice--once when you are at a hospital or clinic, and once when you are not at a hospital or clinic. Record the average of the two measurements. To check your blood pressure when you are not at a hospital or clinic, you can use:  An automated blood pressure machine at a pharmacy.  A home blood pressure monitor.  If you are between 45 years and 39 years old, ask your health care provider if you should take aspirin to prevent strokes.  Have regular diabetes screenings. This involves taking a blood sample to check your fasting blood sugar level.  If you are at a normal weight and have a low risk for diabetes,  have this test once every three years after 34 years of age.  If you are overweight and have a high risk for diabetes, consider being tested at a younger age or more often. PREVENTING INFECTION  Hepatitis B  If you have a higher risk for hepatitis B, you should be screened for this virus. You are considered at high risk for hepatitis B if:  You were born in a country where hepatitis B is common. Ask your health care provider which countries are considered high risk.  Your parents were born in a high-risk country, and you have not been immunized against hepatitis B (hepatitis B vaccine).  You have HIV or AIDS.  You use needles to inject street drugs.  You live with someone who has hepatitis B.  You have had sex with someone who has hepatitis B.  You get hemodialysis treatment.  You take certain medicines for conditions, including cancer, organ transplantation, and autoimmune conditions. Hepatitis C  Blood testing is recommended for:  Everyone born from [birth year] through 1964.  Anyone with known risk factors for hepatitis C. Sexually transmitted infections (STIs)  You should be screened for sexually transmitted infections (STIs) including gonorrhea and chlamydia if:  You are sexually active and are younger than 34 years of age.  You are older than 34 years of age and your health care provider tells you that you are at risk for this type of infection.  Your sexual activity has changed since you were last screened and you are at an increased risk for chlamydia or gonorrhea. Ask your health care provider if you are at risk.  If you do not have HIV, but are at risk, it may be recommended that you take a prescription medicine daily to prevent HIV infection. This is called pre-exposure prophylaxis (PrEP). You are considered at risk if:  You are sexually active and do not regularly use condoms or know the HIV status of your partner(s).  You take drugs by injection.  You are sexually  active with a partner who has HIV. Talk with your health care provider about whether you are at high risk of being infected with HIV. If you choose to begin PrEP, you should first be tested for HIV. You should then be tested every 3 months for as long as you are taking PrEP.  PREGNANCY   If you are premenopausal and you may become pregnant, ask your health care provider about preconception counseling.  If you may  become pregnant, take 400 to 800 micrograms (mcg) of folic acid every day.  If you want to prevent pregnancy, talk to your health care provider about birth control (contraception). OSTEOPOROSIS AND MENOPAUSE   Osteoporosis is a disease in which the bones lose minerals and strength with aging. This can result in serious bone fractures. Your risk for osteoporosis can be identified using a bone density scan.  If you are 61 years of age or older, or if you are at risk for osteoporosis and fractures, ask your health care provider if you should be screened.  Ask your health care provider whether you should take a calcium or vitamin D supplement to lower your risk for osteoporosis.  Menopause may have certain physical symptoms and risks.  Hormone replacement therapy may reduce some of these symptoms and risks. Talk to your health care provider about whether hormone replacement therapy is right for you.  HOME CARE INSTRUCTIONS   Schedule regular health, dental, and eye exams.  Stay current with your immunizations.   Do not use any tobacco products including cigarettes, chewing tobacco, or electronic cigarettes.  If you are pregnant, do not drink alcohol.  If you are breastfeeding, limit how much and how often you drink alcohol.  Limit alcohol intake to no more than 1 drink per day for nonpregnant women. One drink equals 12 ounces of beer, 5 ounces of wine, or 1 ounces of hard liquor.  Do not use street drugs.  Do not share needles.  Ask your health care provider for help if  you need support or information about quitting drugs.  Tell your health care provider if you often feel depressed.  Tell your health care provider if you have ever been abused or do not feel safe at home.   This information is not intended to replace advice given to you by your health care provider. Make sure you discuss any questions you have with your health care provider.   Document Released: 02/21/2011 Document Revised: 08/29/2014 Document Reviewed: 07/10/2013 Elsevier Interactive Patient Education [employer].

## 2016-04-15 NOTE — Progress Notes (Signed)
  Subjective:     Katherine [name] is a 35 y.o. female and is here for a comprehensive physical exam. The patient reports no problems. No regular exercise.    Social History   Social History  . Marital status: Married    Spouse name: Landry Mellowerrell  . Number of children: 2  . Years of education: N/A   Occupational History  . 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 Smoking status: Never Smoker  . Smokeless tobacco: Not on file  . Alcohol use No  . Drug use: No  . Sexual activity: Yes    Partners: Male     Comment: [facility], married, regularly exercises.   Other Topics Concern  . Not on file   Social History Narrative   0 caffeine drink per day. Some regular exercise.   Health Maintenance  Topic Date Due  . INFLUENZA VACCINE  06/21/2016 (Originally 03/22/2016)  . PAP SMEAR  07/16/2017  . TETANUS/TDAP  05/17/2020  . HIV Screening  Completed    The following portions of the patient's history were reviewed and updated as appropriate: allergies, current medications, past family history, past medical history, past social history, past surgical history and problem list.    Review of Systems A comprehensive review of systems was negative.   Objective:    BP 121/78   Pulse 78   Ht 5\' 4"  (1.626 m)   Wt 203 lb (92.1 kg)   LMP 04/05/2016   SpO2 100%   BMI 34.84 kg/m  General appearance: alert, cooperative and appears stated age Head: Normocephalic, without obvious abnormality, atraumatic Eyes: conj clear, EOMI, PEERLA Ears: normal TM's and external ear canals both ears Nose: Nares normal. Septum midline. Mucosa normal. No drainage or sinus tenderness. Throat: lips, mucosa, and tongue normal; teeth and gums normal Neck: no adenopathy, no carotid bruit, no JVD, supple, symmetrical, trachea midline and thyroid not enlarged, symmetric, no tenderness/mass/nodules Back: symmetric, no curvature. ROM normal. No CVA tenderness. Lungs: clear to  auscultation bilaterally Heart: regular rate and rhythm, S1, S2 normal, no murmur, click, rub or gallop Abdomen: soft, non-tender; bowel sounds normal; no masses,  no organomegaly Extremities: extremities normal, atraumatic, no cyanosis or edema Pulses: 2+ and symmetric Skin: Skin color, texture, turgor normal. No rashes or lesions Lymph nodes: Cervical, supraclavicular, and axillary nodes normal. Neurologic: Alert and oriented X 3, normal strength and tone. Normal symmetric reflexes. Normal coordination and gait    Assessment:    Healthy female exam.      Plan:     See After Visit Summary for Counseling Recommendations   Keep up a regular exercise program and make sure you are eating a healthy diet Try to eat 4 servings of dairy a day, or if you are lactose intolerant take a calcium with vitamin D daily.  Your vaccines are up to date.   HTN- Well controlled. Continue current regimen. Follow up in  6 mo. meds RF.   Will call [facility] - Dr. Vickey SagesAtkins for pap smear.

## 2016-04-16 LAB — LIPID PANEL
CHOLESTEROL: 159 mg/dL (ref 125–200)
HDL: 79 mg/dL (ref >=46)
LDL CALC: 71 mg/dL (ref <130)
TRIGLYCERIDES: 44 mg/dL (ref <150)
Total CHOL/HDL Ratio: 2 Ratio (ref <=5.0)
VLDL: 9 mg/dL (ref <30)

## 2016-04-16 LAB — COMPLETE METABOLIC PANEL WITH GFR
ALT: 10 U/L (ref 6–29)
AST: 15 U/L (ref 10–30)
Albumin: 4.3 g/dL (ref 3.6–5.1)
Alkaline Phosphatase: 48 U/L (ref 33–115)
BILIRUBIN TOTAL: 0.2 mg/dL (ref 0.2–1.2)
BUN: 17 mg/dL (ref 7–25)
CALCIUM: 9 mg/dL (ref 8.6–10.2)
CHLORIDE: 105 mmol/L (ref 98–110)
CO2: 24 mmol/L (ref 20–31)
CREATININE: 0.8 mg/dL (ref 0.50–1.10)
Glucose, Bld: 83 mg/dL (ref 65–99)
Potassium: 4 mmol/L (ref 3.5–5.3)
Sodium: 138 mmol/L (ref 135–146)
Total Protein: 7.3 g/dL (ref 6.1–8.1)

## 2016-04-16 LAB — TSH: TSH: 0.62 mIU/L

## 2016-04-16 LAB — FERRITIN: Ferritin: 7 ng/mL — ABNORMAL LOW (ref 10–154)

## 2016-09-06 DIAGNOSIS — Z01419 Encounter for gynecological examination (general) (routine) without abnormal findings: Secondary | ICD-10-CM | POA: Diagnosis not present

## 2016-09-08 LAB — HM PAP SMEAR: HM Pap smear: NEGATIVE

## 2016-12-04 ENCOUNTER — Other Ambulatory Visit: Payer: Self-pay | Admitting: Family Medicine

## 2016-12-04 DIAGNOSIS — I10 Essential (primary) hypertension: Secondary | ICD-10-CM

## 2017-01-03 ENCOUNTER — Other Ambulatory Visit: Payer: Self-pay | Admitting: Family Medicine

## 2017-01-03 DIAGNOSIS — I10 Essential (primary) hypertension: Secondary | ICD-10-CM

## 2017-01-16 ENCOUNTER — Other Ambulatory Visit: Payer: Self-pay | Admitting: Family Medicine

## 2017-01-16 DIAGNOSIS — I10 Essential (primary) hypertension: Secondary | ICD-10-CM

## 2017-01-31 ENCOUNTER — Telehealth: Payer: Self-pay | Admitting: Family Medicine

## 2017-01-31 NOTE — Telephone Encounter (Signed)
Left a message for pt to call and schedule a f/u appt on her BP with Dr.Metheney

## 2017-02-16 ENCOUNTER — Encounter: Payer: Self-pay | Admitting: Family Medicine

## 2017-02-23 ENCOUNTER — Encounter: Payer: Self-pay | Admitting: Family Medicine

## 2017-02-23 ENCOUNTER — Ambulatory Visit (INDEPENDENT_AMBULATORY_CARE_PROVIDER_SITE_OTHER): Payer: 59 | Admitting: Family Medicine

## 2017-02-23 VITALS — BP 144/89 | HR 70 | Resp 16 | Wt 231.5 lb

## 2017-02-23 DIAGNOSIS — I10 Essential (primary) hypertension: Secondary | ICD-10-CM | POA: Diagnosis not present

## 2017-02-23 DIAGNOSIS — Z6839 Body mass index (BMI) 39.0-39.9, adult: Secondary | ICD-10-CM | POA: Diagnosis not present

## 2017-02-23 DIAGNOSIS — Z0000 Encounter for general adult medical examination without abnormal findings: Secondary | ICD-10-CM

## 2017-02-23 DIAGNOSIS — E282 Polycystic ovarian syndrome: Secondary | ICD-10-CM | POA: Diagnosis not present

## 2017-02-23 DIAGNOSIS — F320 Major depressive disorder, single episode, mild: Secondary | ICD-10-CM

## 2017-02-23 LAB — COMPLETE METABOLIC PANEL WITH GFR
ALBUMIN: 4.3 g/dL (ref 3.6–5.1)
ALK PHOS: 63 U/L (ref 33–115)
ALT: 29 U/L (ref 6–29)
AST: 25 U/L (ref 10–30)
BUN: 12 mg/dL (ref 7–25)
CALCIUM: 9.3 mg/dL (ref 8.6–10.2)
CHLORIDE: 104 mmol/L (ref 98–110)
CO2: 23 mmol/L (ref 20–31)
CREATININE: 0.84 mg/dL (ref 0.50–1.10)
GFR, Est African American: >89 mL/min (ref >=60)
GFR, Est Non African American: >89 mL/min (ref >=60)
Glucose, Bld: 80 mg/dL (ref 65–99)
Potassium: 3.8 mmol/L (ref 3.5–5.3)
Sodium: 137 mmol/L (ref 135–146)
TOTAL PROTEIN: 7.6 g/dL (ref 6.1–8.1)
Total Bilirubin: 0.2 mg/dL (ref 0.2–1.2)

## 2017-02-23 LAB — LIPID PANEL W/REFLEX DIRECT LDL
CHOLESTEROL: 196 mg/dL (ref <200)
HDL: 69 mg/dL (ref >50)
LDL-Cholesterol: 110 mg/dL — ABNORMAL HIGH
NON-HDL CHOLESTEROL (CALC): 127 mg/dL (ref <130)
Total CHOL/HDL Ratio: 2.8 Ratio (ref <5.0)
Triglycerides: 83 mg/dL (ref <150)

## 2017-02-23 MED ORDER — LISINOPRIL 10 MG PO TABS: 10.0000 mg | ORAL_TABLET | Freq: Every day | ORAL | 3 refills | Status: DC

## 2017-02-23 NOTE — Progress Notes (Addendum)
Subjective:     Katherine [name] is a 36 y.o. female and is here for a comprehensive physical exam. The patient reports no problems.   Hypertension- Pt denies chest pain, SOB, dizziness, or heart palpitations.  Taking meds as directed w/o problems.  Denies medication side effects.   Out of her med x 2 weeks.   She has been feeling down and sad for several months.  She says that she's really tried to make some changes. She's tried Orthoptistjournaling. And more recently has started exercising. Part of her feeling down has been because of her recent weight gain. She's decided to really take it into her own hands and make some changes. She says that her husband has actually been very supportive of her. She thought about doing counseling but says she's actually been feeling a little better in the last couple of weeks.  Social History   Social History  . Marital status: Married    Spouse name: Landry Mellowerrell  . Number of children: 2  . Years of education: N/A   Occupational History  . 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 Smoking status: Never Smoker  . Smokeless tobacco: Never Used  . Alcohol use No  . Drug use: No  . Sexual activity: Yes    Partners: Male     Comment: [facility], married, regularly exercises.   Other Topics Concern  . Not on file   Social History Narrative   0 caffeine drink per day. Some regular exercise.   Health Maintenance  Topic Date Due  . INFLUENZA VACCINE  03/22/2017  . PAP SMEAR  07/16/2017  . TETANUS/TDAP  05/17/2020  . HIV Screening  Completed    The following portions of the patient's history were reviewed and updated as appropriate: allergies, current medications, past family history, past medical history, past social history, past surgical history and problem list.  Review of Systems A comprehensive review of systems was negative.   Objective:    BP (!) 144/89   Pulse 70   Resp 16   Wt 231 lb 8 oz (105 kg)   BMI 39.74  kg/m  General appearance: alert, cooperative and appears stated age Head: Normocephalic, without obvious abnormality, atraumatic Eyes: conj clear, EOMI, PEERLA Ears: normal TM's and external ear canals both ears Nose: Nares normal. Septum midline. Mucosa normal. No drainage or sinus tenderness. Throat: lips, mucosa, and tongue normal; teeth and gums normal Neck: no adenopathy, no carotid bruit, no JVD, supple, symmetrical, trachea midline and thyroid not enlarged, symmetric, no tenderness/mass/nodules Back: symmetric, no curvature. ROM normal. No CVA tenderness. Lungs: clear to auscultation bilaterally Heart: regular rate and rhythm, S1, S2 normal, no murmur, click, rub or gallop Abdomen: soft, non-tender; bowel sounds normal; no masses,  no organomegaly Extremities: extremities normal, atraumatic, no cyanosis or edema Pulses: 2+ and symmetric Skin: Skin color, texture, turgor normal. No rashes or lesions Lymph nodes: Cervical, supraclavicular, and axillary nodes normal. Neurologic: Alert and oriented X 3, normal strength and tone. Normal symmetric reflexes. Normal coordination and gait    Assessment:    Healthy female exam.      Plan:     See After Visit Summary for Counseling Recommendations    Keep up a regular exercise program and make sure you are eating a healthy diet Try to eat 4 servings of dairy a day, or if you are lactose intolerant take a calcium with vitamin D daily.  Your vaccines are up to date.   Hypertension-she's actually been out of her blood pressure pill for about 2 weeks.Medication refill. Due for up-to-date blood work.  PCOS - check A1C.   Mild depression - PHQ 9 score of 11 today. Discussed options. offered counselng but she is feeling much better and will consider therapy/counseling if not better in few weeks.  She's not interested in medication this time. Did encourage her to call me back if her mood does not continue to improve in the next few  weeks.  BMI 39-she's decided to get on track with diet and exercise. She wants to get back down under 200 pounds.

## 2017-02-24 LAB — HEMOGLOBIN A1C
Hgb A1c MFr Bld: 5.5 % (ref <5.7)
Mean Plasma Glucose: 111 mg/dL

## 2017-03-08 ENCOUNTER — Encounter: Payer: Self-pay | Admitting: Family Medicine

## 2017-08-18 ENCOUNTER — Ambulatory Visit: Payer: Self-pay | Admitting: Family Medicine

## 2017-08-21 ENCOUNTER — Ambulatory Visit (INDEPENDENT_AMBULATORY_CARE_PROVIDER_SITE_OTHER): Payer: 59 | Admitting: Family Medicine

## 2017-08-21 ENCOUNTER — Encounter: Payer: Self-pay | Admitting: Family Medicine

## 2017-08-21 VITALS — BP 115/64 | HR 92 | Ht 64.0 in | Wt 241.0 lb

## 2017-08-21 DIAGNOSIS — L821 Other seborrheic keratosis: Secondary | ICD-10-CM | POA: Diagnosis not present

## 2017-08-21 NOTE — Progress Notes (Signed)
   Subjective:    Patient ID: Katherine [name], female    DOB: 12/24/1980, 36 y.o.   MRN: [id number]  HPI 36 year old female comes in wanting to be evaluated for a lesion on her left facial cheek.  Is been there for some time but she is just noticed over the last week it seems larger than previously.  She denies any pain or irritation or itching.  She has several tiny moles on her face but says this 1 is significantly larger than the rest.   Review of Systems     Objective:   Physical Exam  Constitutional: She appears well-developed and well-nourished.  Skin:  Left facial cheek with rough textured papule approx  5mm            Assessment & Plan:  Lesion is most consistent with a seborrheic keratosis-recommend cryotherapy for definitive treatment.  If lesion recurs then recommend shave biopsy for further evaluation.  Cryotherapy Procedure Note  Pre-operative Diagnosis: seb keratosis  Post-operative Diagnosis: same  Locations: right facial cheek  Indications: getting larger  Anesthesia: not required    Procedure Details  Patient informed of risks (permanent scarring, infection, light or dark discoloration, bleeding, infection, weakness, numbness and recurrence of the lesion) and benefits of the procedure and verbal informed consent obtained.  The areas are treated with liquid nitrogen therapy, frozen until ice ball extended 2 mm beyond lesion, allowed to thaw, and treated again. The patient tolerated procedure well.  The patient was instructed on post-op care, warned that there may be blister formation, redness and pain. Recommend OTC analgesia as needed for pain.  Condition: Stable  Complications: none.  Plan: 1. Instructed to keep the area dry and covered for 24-48h and clean thereafter. 2. Warning signs of infection were reviewed.   3. Recommended that the patient use OTC acetaminophen as needed for pain.  Apply Vaseline once the lesion feels off. 4. Return if  lesion recurs for definitive biopsy if needed.

## 2017-10-19 ENCOUNTER — Other Ambulatory Visit: Payer: Self-pay | Admitting: Family Medicine

## 2017-10-19 DIAGNOSIS — I10 Essential (primary) hypertension: Secondary | ICD-10-CM

## 2017-10-19 DIAGNOSIS — Z01419 Encounter for gynecological examination (general) (routine) without abnormal findings: Secondary | ICD-10-CM | POA: Diagnosis not present

## 2018-10-01 ENCOUNTER — Other Ambulatory Visit: Payer: Self-pay | Admitting: Family Medicine

## 2018-10-01 DIAGNOSIS — I10 Essential (primary) hypertension: Secondary | ICD-10-CM

## 2018-10-30 ENCOUNTER — Other Ambulatory Visit: Payer: Self-pay | Admitting: Family Medicine

## 2018-10-30 DIAGNOSIS — I10 Essential (primary) hypertension: Secondary | ICD-10-CM

## 2018-11-09 ENCOUNTER — Other Ambulatory Visit: Payer: Self-pay | Admitting: Family Medicine

## 2018-11-09 DIAGNOSIS — I10 Essential (primary) hypertension: Secondary | ICD-10-CM

## 2018-11-17 ENCOUNTER — Other Ambulatory Visit: Payer: Self-pay | Admitting: Family Medicine

## 2018-11-17 DIAGNOSIS — I10 Essential (primary) hypertension: Secondary | ICD-10-CM

## 2018-11-19 NOTE — Telephone Encounter (Signed)
Routing for scheduling

## 2018-11-19 NOTE — Telephone Encounter (Signed)
Please have her schedule a virtual visit for meds. Hasn't be seen since 2018

## 2018-11-19 NOTE — Telephone Encounter (Signed)
Patient scheduled.

## 2018-11-19 NOTE — Telephone Encounter (Signed)
Have we scheduled this patient?

## 2018-11-20 ENCOUNTER — Other Ambulatory Visit: Payer: Self-pay

## 2018-11-20 ENCOUNTER — Encounter: Payer: Self-pay | Admitting: Family Medicine

## 2018-11-20 ENCOUNTER — Ambulatory Visit (INDEPENDENT_AMBULATORY_CARE_PROVIDER_SITE_OTHER): Payer: 59 | Admitting: Family Medicine

## 2018-11-20 VITALS — BP 111/76 | HR 75 | Wt 242.0 lb

## 2018-11-20 DIAGNOSIS — I10 Essential (primary) hypertension: Secondary | ICD-10-CM | POA: Diagnosis not present

## 2018-11-20 MED ORDER — LISINOPRIL 10 MG PO TABS: 10.0000 mg | ORAL_TABLET | Freq: Every day | ORAL | 1 refills | Status: DC

## 2018-11-20 NOTE — Progress Notes (Signed)
Virtual Visit via Video Note  I connected with Katherine [name] on 11/20/18 at  2:00 PM EDT by a video enabled telemedicine application and verified that I am speaking with the correct person using two identifiers.   I discussed the limitations of evaluation and management by telemedicine and the availability of in person appointments. The patient expressed understanding and agreed to proceed.      Subjective:    CC: BP  HPI:  Hypertension- Pt denies chest pain, SOB, dizziness, or heart palpitations.  Taking meds as directed w/o problems.  Denies medication side effects.  She is getting a little dry cough on it.  She doesn't want to change the medication.     Past medical history, Surgical history, Family history not pertinant except as noted below, Social history, Allergies, and medications have been entered into the medical record, reviewed, and corrections made.   Review of Systems: No fevers, chills, night sweats, weight loss, chest pain, or shortness of breath.   Objective:    General: Well Developed, well nourished, and in no acute distress.  Neuro: Alert and oriented x3, extra-ocular muscles intact, sensation grossly intact.  HEENT: Normocephalic, atraumatic  Skin: Warm and dry, no rashes. Cardiac: Regular rate and rhythm, no murmurs rubs or gallops, no lower extremity edema.  Respiratory: Clear to auscultation bilaterally. Not using accessory muscles, speaking in full sentences.   Impression and Recommendations:    HTN - Home BP looks ok. Will continue current regimen.  RF sent F/U in 6 months.  Due for labs.  Order sent.        I discussed the assessment and treatment plan with the patient. The patient was provided an opportunity to ask questions and all were answered. The patient agreed with the plan and demonstrated an understanding of the instructions.   The patient was advised to call back or seek an in-person evaluation if the symptoms worsen or if the  condition fails to improve as anticipated.  I provided 10 minutes of non-face-to-face time during this encounter.   Nani Gasser, MD

## 2018-11-20 NOTE — Progress Notes (Signed)
lvm informing pt that I was calling to obtain her VS and go over her medications prior to her webvisit w/Dr. Linford Arnold.Laureen Ochs, Viann Shove, CMA

## 2019-02-09 LAB — COMPLETE METABOLIC PANEL WITH GFR
AG RATIO: 1.4 (calc) (ref 1.0–2.5)
ALT: 11 U/L (ref 6–29)
AST: 15 U/L (ref 10–30)
Albumin: 4.5 g/dL (ref 3.6–5.1)
Alkaline phosphatase (APISO): 54 U/L (ref 31–125)
BUN: 13 mg/dL (ref 7–25)
CALCIUM: 9.5 mg/dL (ref 8.6–10.2)
CHLORIDE: 106 mmol/L (ref 98–110)
CO2: 22 mmol/L (ref 20–32)
Creat: 0.97 mg/dL (ref 0.50–1.10)
GFR, EST AFRICAN AMERICAN: 87 mL/min/{1.73_m2} (ref >=60)
GFR, EST NON AFRICAN AMERICAN: 75 mL/min/{1.73_m2} (ref >=60)
Globulin: 3.2 g/dL (calc) (ref 1.9–3.7)
Glucose, Bld: 85 mg/dL (ref 65–99)
Potassium: 4 mmol/L (ref 3.5–5.3)
Sodium: 139 mmol/L (ref 135–146)
Total Bilirubin: 0.5 mg/dL (ref 0.2–1.2)
Total Protein: 7.7 g/dL (ref 6.1–8.1)

## 2019-02-09 LAB — CBC
HCT: 36.3 % (ref 35.0–45.0)
Hemoglobin: 11.6 g/dL — ABNORMAL LOW (ref 11.7–15.5)
MCH: 25.7 pg — ABNORMAL LOW (ref 27.0–33.0)
MCHC: 32 g/dL (ref 32.0–36.0)
MCV: 80.3 fL (ref 80.0–100.0)
MPV: 11 fL (ref 7.5–12.5)
Platelets: 265 10*3/uL (ref 140–400)
RBC: 4.52 10*6/uL (ref 3.80–5.10)
RDW: 14.5 % (ref 11.0–15.0)
WBC: 4.7 10*3/uL (ref 3.8–10.8)

## 2019-02-09 LAB — LIPID PANEL
Cholesterol: 193 mg/dL (ref <200)
HDL: 60 mg/dL (ref >=50)
LDL Cholesterol (Calc): 116 mg/dL (calc) — ABNORMAL HIGH
Non-HDL Cholesterol (Calc): 133 mg/dL (calc) — ABNORMAL HIGH (ref <130)
Total CHOL/HDL Ratio: 3.2 (calc) (ref <5.0)
Triglycerides: 80 mg/dL (ref <150)

## 2019-02-09 LAB — HEMOGLOBIN A1C
Hgb A1c MFr Bld: 5.6 % of total Hgb (ref <5.7)
MEAN PLASMA GLUCOSE: 114 (calc)
eAG (mmol/L): 6.3 (calc)

## 2019-05-11 ENCOUNTER — Other Ambulatory Visit: Payer: Self-pay | Admitting: Family Medicine

## 2019-05-11 DIAGNOSIS — I10 Essential (primary) hypertension: Secondary | ICD-10-CM

## 2019-08-06 ENCOUNTER — Other Ambulatory Visit: Payer: Self-pay | Admitting: Family Medicine

## 2019-08-06 DIAGNOSIS — I10 Essential (primary) hypertension: Secondary | ICD-10-CM

## 2019-08-06 NOTE — Telephone Encounter (Signed)
Must make appointment 

## 2019-10-31 ENCOUNTER — Other Ambulatory Visit: Payer: Self-pay | Admitting: Family Medicine

## 2019-10-31 DIAGNOSIS — I10 Essential (primary) hypertension: Secondary | ICD-10-CM

## 2019-11-11 ENCOUNTER — Ambulatory Visit (INDEPENDENT_AMBULATORY_CARE_PROVIDER_SITE_OTHER): Payer: 59 | Admitting: Family Medicine

## 2019-11-11 ENCOUNTER — Encounter: Payer: Self-pay | Admitting: Family Medicine

## 2019-11-11 ENCOUNTER — Other Ambulatory Visit: Payer: Self-pay

## 2019-11-11 VITALS — BP 119/74 | HR 69 | Ht 64.0 in | Wt 249.0 lb

## 2019-11-11 DIAGNOSIS — I10 Essential (primary) hypertension: Secondary | ICD-10-CM | POA: Diagnosis not present

## 2019-11-11 DIAGNOSIS — Z0000 Encounter for general adult medical examination without abnormal findings: Secondary | ICD-10-CM

## 2019-11-11 LAB — BASIC METABOLIC PANEL
BUN: 15 (ref 4–21)
CO2: 26 — AB (ref 13–22)
Chloride: 108 (ref 99–108)
Creatinine: 1 (ref 0.5–1.1)
Glucose: 86
Potassium: 3.9 (ref 3.4–5.3)
Sodium: 142 (ref 137–147)

## 2019-11-11 LAB — IRON,TIBC AND FERRITIN PANEL
Ferritin: 10
Iron: 44

## 2019-11-11 LAB — CMP14+EGFR: Protein: 7.4

## 2019-11-11 LAB — HEPATIC FUNCTION PANEL
ALT: 12 (ref 7–35)
AST: 17 (ref 13–35)
Alkaline Phosphatase: 52 (ref 25–125)
Bilirubin, Total: 0.3

## 2019-11-11 LAB — CBC AND DIFFERENTIAL
HCT: 33 — AB (ref 36–46)
Hemoglobin: 10.5 — AB (ref 12.0–16.0)
Platelets: 282 (ref 150–399)
WBC: 4.8

## 2019-11-11 LAB — LIPID PANEL
Cholesterol: 173 (ref 0–200)
HDL: 61 (ref 35–70)
LDL Cholesterol: 97
Triglycerides: 64 (ref 40–160)

## 2019-11-11 LAB — CBC: RBC: 4.29 (ref 3.87–5.11)

## 2019-11-11 LAB — COMPREHENSIVE METABOLIC PANEL
Calcium: 9.3 (ref 8.7–10.7)
Globulin: 3.1

## 2019-11-11 LAB — HEMOGLOBIN A1C: Hemoglobin A1C: 5.7

## 2019-11-11 MED ORDER — LISINOPRIL 10 MG PO TABS: ORAL_TABLET | ORAL | 1 refills | Status: DC

## 2019-11-11 NOTE — Progress Notes (Signed)
Subjective:     Katherine [name] is a 39 y.o. female and is here for a comprehensive physical exam. The patient reports no problems.  Social History   Socioeconomic History  . Marital status: Married    Spouse name: Enid Derry  . Number of children: 2  . Years of education: Not on file  . Highest education level: Not on file  Occupational History  . 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 Smoking status: Never Smoker  . Smokeless tobacco: Never Used  Substance and Sexual Activity  . Alcohol use: No  . Drug use: No  . Sexual activity: Yes    Partners: Male    Comment: [facility] [place], married, regularly exercises.  Other Topics Concern  . Not on file  Social History Narrative   0 caffeine drink per day. Some regular exercise.   Social Determinants of Health   Financial Resource Strain:   . Difficulty of Paying Living Expenses:   Food Insecurity:   . Worried About Charity fundraiser in the Last Year:   . Arboriculturist in the Last Year:   Transportation Needs:   . Film/video editor (Medical):   [name] Lack of Transportation (Non-Medical):   Physical Activity:   . Days of Exercise per Week:   . Minutes of Exercise per Session:   Stress:   . Feeling of Stress :   Social Connections:   . Frequency of Communication with Friends and Family:   . Frequency of Social Gatherings with Friends and Family:   . Attends Religious Services:   . Active Member of Clubs or Organizations:   . Attends Archivist Meetings:   [name] Marital Status:   Intimate Partner Violence:   . Fear of Current or Ex-Partner:   . Emotionally Abused:   [name] Physically Abused:   . Sexually Abused:    Health Maintenance  Topic Date Due  . PAP SMEAR-Modifier  09/09/2019  . TETANUS/TDAP  05/17/2020  . INFLUENZA VACCINE  Completed  . HIV Screening  Completed    The following portions of the patient's history were reviewed and updated as appropriate:  allergies, current medications, past family history, past medical history, past social history, past surgical history and problem list.  Review of Systems A comprehensive review of systems was negative.   Objective:    BP 119/74   Pulse 69   Ht 5\' 4"  (1.626 m)   Wt 249 lb (112.9 kg)   LMP 10/25/2019 (Exact Date)   SpO2 99%   BMI 42.74 kg/m  General appearance: alert, cooperative and appears stated age Head: Normocephalic, without obvious abnormality, atraumatic Eyes: conj clear, EOMi, PEERLA Ears: normal TM's and external ear canals both ears Nose: Nares normal. Septum midline. Mucosa normal. No drainage or sinus tenderness. Throat: lips, mucosa, and tongue normal; teeth and gums normal Neck: no adenopathy, no carotid bruit, no JVD, supple, symmetrical, trachea midline and thyroid not enlarged, symmetric, no tenderness/mass/nodules Back: symmetric, no curvature. ROM normal. No CVA tenderness. Lungs: clear to auscultation bilaterally Heart: regular rate and rhythm, S1, S2 normal, no murmur, click, rub or gallop Abdomen: soft, non-tender; bowel sounds normal; no masses,  no organomegaly Extremities: extremities normal, atraumatic, no cyanosis or edema Pulses: 2+ and symmetric Skin: Skin color, texture, turgor normal. No rashes or lesions Lymph nodes: Cervical adenopathy: nl and Supraclavicular adenopathy: nl Neurologic: Alert and oriented X 3, normal  strength and tone. Normal symmetric reflexes. Normal coordination and gait    Assessment:    Healthy female exam.      Plan:     See After Visit Summary for Counseling Recommendations   Keep up a regular exercise program and make sure you are eating a healthy diet Try to eat 4 servings of dairy a day, or if you are lactose intolerant take a calcium with vitamin D daily.  Your vaccines are up to date.  Due for labs.   BP - well controlled.  Due for labs.

## 2019-11-11 NOTE — Patient Instructions (Signed)
Health Maintenance, Female Adopting a healthy lifestyle and getting preventive care are important in promoting health and wellness. Ask your health care provider about:  The right schedule for you to have regular tests and exams.  Things you can do on your own to prevent diseases and keep yourself healthy. What should I know about diet, weight, and exercise? Eat a healthy diet   Eat a diet that includes plenty of vegetables, fruits, low-fat dairy products, and lean protein.  Do not eat a lot of foods that are high in solid fats, added sugars, or sodium. Maintain a healthy weight Body mass index (BMI) is used to identify weight problems. It estimates body fat based on height and weight. Your health care provider can help determine your BMI and help you achieve or maintain a healthy weight. Get regular exercise Get regular exercise. This is one of the most important things you can do for your health. Most adults should:  Exercise for at least 150 minutes each week. The exercise should increase your heart rate and make you sweat (moderate-intensity exercise).  Do strengthening exercises at least twice a week. This is in addition to the moderate-intensity exercise.  Spend less time sitting. Even light physical activity can be beneficial. Watch cholesterol and blood lipids Have your blood tested for lipids and cholesterol at 38 years of age, then have this test every 5 years. Have your cholesterol levels checked more often if:  Your lipid or cholesterol levels are high.  You are older than 38 years of age.  You are at high risk for heart disease. What should I know about cancer screening? Depending on your health history and family history, you may need to have cancer screening at various ages. This may include screening for:  Breast cancer.  Cervical cancer.  Colorectal cancer.  Skin cancer.  Lung cancer. What should I know about heart disease, diabetes, and high blood  pressure? Blood pressure and heart disease  High blood pressure causes heart disease and increases the risk of stroke. This is more likely to develop in people who have high blood pressure readings, are of African descent, or are overweight.  Have your blood pressure checked: ? Every 3-5 years if you are 18-39 years of age. ? Every year if you are 40 years old or older. Diabetes Have regular diabetes screenings. This checks your fasting blood sugar level. Have the screening done:  Once every three years after age 40 if you are at a normal weight and have a low risk for diabetes.  More often and at a younger age if you are overweight or have a high risk for diabetes. What should I know about preventing infection? Hepatitis B If you have a higher risk for hepatitis B, you should be screened for this virus. Talk with your health care provider to find out if you are at risk for hepatitis B infection. Hepatitis C Testing is recommended for:  Everyone born from 1945 through 1965.  Anyone with known risk factors for hepatitis C. Sexually transmitted infections (STIs)  Get screened for STIs, including gonorrhea and chlamydia, if: ? You are sexually active and are younger than 38 years of age. ? You are older than 38 years of age and your health care provider tells you that you are at risk for this type of infection. ? Your sexual activity has changed since you were last screened, and you are at increased risk for chlamydia or gonorrhea. Ask your health care provider if   you are at risk.  Ask your health care provider about whether you are at high risk for HIV. Your health care provider may recommend a prescription medicine to help prevent HIV infection. If you choose to take medicine to prevent HIV, you should first get tested for HIV. You should then be tested every 3 months for as long as you are taking the medicine. Pregnancy  If you are about to stop having your period (premenopausal) and  you may become pregnant, seek counseling before you get pregnant.  Take 400 to 800 micrograms (mcg) of folic acid every day if you become pregnant.  Ask for birth control (contraception) if you want to prevent pregnancy. Osteoporosis and menopause Osteoporosis is a disease in which the bones lose minerals and strength with aging. This can result in bone fractures. If you are 65 years old or older, or if you are at risk for osteoporosis and fractures, ask your health care provider if you should:  Be screened for bone loss.  Take a calcium or vitamin D supplement to lower your risk of fractures.  Be given hormone replacement therapy (HRT) to treat symptoms of menopause. Follow these instructions at home: Lifestyle  Do not use any products that contain nicotine or tobacco, such as cigarettes, e-cigarettes, and chewing tobacco. If you need help quitting, ask your health care provider.  Do not use street drugs.  Do not share needles.  Ask your health care provider for help if you need support or information about quitting drugs. Alcohol use  Do not drink alcohol if: ? Your health care provider tells you not to drink. ? You are pregnant, may be pregnant, or are planning to become pregnant.  If you drink alcohol: ? Limit how much you use to 0-1 drink a day. ? Limit intake if you are breastfeeding.  Be aware of how much alcohol is in your drink. In the [place] one drink equals one 12 oz bottle of beer (355 mL), one 5 oz glass of wine (148 mL), or one 1 oz glass of hard liquor (44 mL). General instructions  Schedule regular health, dental, and eye exams.  Stay current with your vaccines.  Tell your health care provider if: ? You often feel depressed. ? You have ever been abused or do not feel safe at home. Summary  Adopting a healthy lifestyle and getting preventive care are important in promoting health and wellness.  Follow your health care provider's instructions about healthy  diet, exercising, and getting tested or screened for diseases.  Follow your health care provider's instructions on monitoring your cholesterol and blood pressure. This information is not intended to replace advice given to you by your health care provider. Make sure you discuss any questions you have with your health care provider. Document Revised: 08/01/2018 Document Reviewed: 08/01/2018 Elsevier Patient Education  2020 Elsevier Inc.  

## 2019-11-15 LAB — LIPID PANEL
Cholesterol: 173 mg/dL (ref <200)
HDL: 61 mg/dL (ref >=50)
LDL Cholesterol (Calc): 97 mg/dL (calc)
Non-HDL Cholesterol (Calc): 112 mg/dL (calc) (ref <130)
Total CHOL/HDL Ratio: 2.8 (calc) (ref <5.0)
Triglycerides: 64 mg/dL (ref <150)

## 2019-11-15 LAB — COMPLETE METABOLIC PANEL WITH GFR
AG Ratio: 1.4 (calc) (ref 1.0–2.5)
ALT: 12 U/L (ref 6–29)
AST: 17 U/L (ref 10–30)
Albumin: 4.3 g/dL (ref 3.6–5.1)
Alkaline phosphatase (APISO): 52 U/L (ref 31–125)
BUN: 15 mg/dL (ref 7–25)
CO2: 26 mmol/L (ref 20–32)
Calcium: 9.3 mg/dL (ref 8.6–10.2)
Chloride: 108 mmol/L (ref 98–110)
Creat: 0.95 mg/dL (ref 0.50–1.10)
GFR, Est African American: 89 mL/min/{1.73_m2} (ref >=60)
GFR, Est Non African American: 77 mL/min/{1.73_m2} (ref >=60)
Globulin: 3.1 g/dL (calc) (ref 1.9–3.7)
Glucose, Bld: 86 mg/dL (ref 65–99)
Potassium: 3.9 mmol/L (ref 3.5–5.3)
Sodium: 142 mmol/L (ref 135–146)
Total Bilirubin: 0.3 mg/dL (ref 0.2–1.2)
Total Protein: 7.4 g/dL (ref 6.1–8.1)

## 2019-11-15 LAB — CBC
HCT: 33.4 % — ABNORMAL LOW (ref 35.0–45.0)
Hemoglobin: 10.5 g/dL — ABNORMAL LOW (ref 11.7–15.5)
MCH: 24.5 pg — ABNORMAL LOW (ref 27.0–33.0)
MCHC: 31.4 g/dL — ABNORMAL LOW (ref 32.0–36.0)
MCV: 77.9 fL — ABNORMAL LOW (ref 80.0–100.0)
MPV: 11.1 fL (ref 7.5–12.5)
Platelets: 282 10*3/uL (ref 140–400)
RBC: 4.29 10*6/uL (ref 3.80–5.10)
RDW: 14.6 % (ref 11.0–15.0)
WBC: 4.8 10*3/uL (ref 3.8–10.8)

## 2019-11-15 LAB — HEMOGLOBIN A1C
Hgb A1c MFr Bld: 5.7 % of total Hgb — ABNORMAL HIGH (ref <5.7)
Mean Plasma Glucose: 117 (calc)
eAG (mmol/L): 6.5 (calc)

## 2019-11-15 LAB — FERRITIN: Ferritin: 10 ng/mL — ABNORMAL LOW (ref 16–154)

## 2019-11-15 LAB — IRON: Iron: 44 ug/dL (ref 40–190)

## 2020-01-29 ENCOUNTER — Ambulatory Visit: Payer: 59

## 2020-05-18 ENCOUNTER — Other Ambulatory Visit: Payer: Self-pay | Admitting: Family Medicine

## 2020-05-18 DIAGNOSIS — I10 Essential (primary) hypertension: Secondary | ICD-10-CM

## 2020-11-09 ENCOUNTER — Other Ambulatory Visit: Payer: Self-pay | Admitting: Family Medicine

## 2020-11-09 DIAGNOSIS — I10 Essential (primary) hypertension: Secondary | ICD-10-CM

## 2020-11-12 ENCOUNTER — Encounter: Payer: 59 | Admitting: Family Medicine

## 2020-12-04 LAB — LIPID PANEL
Cholesterol: 198 (ref 0–200)
HDL: 65 (ref 35–70)
LDL Cholesterol: 114
Triglycerides: 93 (ref 40–160)

## 2020-12-04 LAB — HEMOGLOBIN A1C: Hemoglobin A1C: 5.7

## 2020-12-04 LAB — BASIC METABOLIC PANEL: Glucose: 90

## 2020-12-10 ENCOUNTER — Encounter: Payer: Self-pay | Admitting: Family Medicine

## 2020-12-10 ENCOUNTER — Other Ambulatory Visit: Payer: Self-pay

## 2020-12-10 ENCOUNTER — Ambulatory Visit (INDEPENDENT_AMBULATORY_CARE_PROVIDER_SITE_OTHER): Payer: No Typology Code available for payment source | Admitting: Family Medicine

## 2020-12-10 VITALS — BP 117/56 | HR 88 | Ht 64.0 in | Wt 253.0 lb

## 2020-12-10 DIAGNOSIS — R7301 Impaired fasting glucose: Secondary | ICD-10-CM | POA: Diagnosis not present

## 2020-12-10 DIAGNOSIS — Z0000 Encounter for general adult medical examination without abnormal findings: Secondary | ICD-10-CM | POA: Diagnosis not present

## 2020-12-10 DIAGNOSIS — Z23 Encounter for immunization: Secondary | ICD-10-CM | POA: Diagnosis not present

## 2020-12-10 DIAGNOSIS — D509 Iron deficiency anemia, unspecified: Secondary | ICD-10-CM | POA: Diagnosis not present

## 2020-12-10 DIAGNOSIS — G479 Sleep disorder, unspecified: Secondary | ICD-10-CM

## 2020-12-10 DIAGNOSIS — F320 Major depressive disorder, single episode, mild: Secondary | ICD-10-CM

## 2020-12-10 DIAGNOSIS — L732 Hidradenitis suppurativa: Secondary | ICD-10-CM

## 2020-12-10 LAB — POCT HEMOGLOBIN: Hemoglobin: 8 g/dL — AB (ref 11–14.6)

## 2020-12-10 NOTE — Progress Notes (Addendum)
CPE  Established Patient Office Visit  Subjective:  Patient ID: Katherine [name], female    DOB: [birth year]  Age: 40 y.o. MRN: [id number]  CC:  Chief Complaint  Patient presents with  . Annual Exam    HPI [name] presents for CPE.[name]  She is doing well overall.  No specific concerns.  Pap smear up-to-date with her OB/GYN.  She did bring in her recent labs that were performed 22nd.  Performed a lipid panel and A1c.  Her A1c was still 5.7.  She is also currently on amoxicillin and Cortisporin drops for a ear infection on the left side.  She says it is actually feeling much better.  She was also recently diagnosed by dermatology with hidradenitis suppurativa.  She currently uses a clindamycin topical as needed.  Last Tdap was in 2011.  She also has not been sleeping great lately and wanted to know what she could use over-the-counter.  Past Medical History:  Diagnosis Date  . Anemia   . Hypertension   . Polycystic ovarian disease   . Polycystic ovarian syndrome     Past Surgical History:  Procedure Laterality Date  . CESAREAN SECTION  February 2011  . CESAREAN SECTION  02/22/2012   Procedure: CESAREAN SECTION;  Surgeon: Zelphia Cairo, MD;  Location: [facility];  Service: Gynecology;  Laterality: N/A;  . Hysterosalpingogram  2010    Family History  Problem Relation Age of Onset  . Diabetes Father   . Alcohol abuse Maternal Uncle   . Diabetes Maternal Uncle   . Alcohol abuse Maternal Grandfather   . Kidney failure Father        secondary to DM  . Fibromyalgia Mother   . Diabetes Mother     Social History   Socioeconomic History  . Marital status: Married    Spouse name: [name]  . Number of children: 2  . Years of education: Not on file  . Highest education level: Not on file  Occupational History  . 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 Smoking status: Never Smoker  . Smokeless tobacco: Never Used   Substance and Sexual Activity  . Alcohol use: No  . Drug use: No  . Sexual activity: Yes    Partners: Male    Comment: [facility] OR, married, regularly exercises.  Other Topics Concern  . Not on file  Social History Narrative   0 caffeine drink per day. Some regular exercise.   Social Determinants of Health   Financial Resource Strain: Not on file  Food Insecurity: Not on file  Transportation Needs: Not on file  Physical Activity: Not on file  Stress: Not on file  Social Connections: Not on file  Intimate Partner Violence: Not on file    Outpatient Medications Prior to Visit  Medication Sig Dispense Refill  . amoxicillin (AMOXIL) 875 MG tablet Take 875 mg by mouth 2 (two) times daily.    . clindamycin (CLEOCIN T) 1 % external solution Apply topically 2 (two) times daily.  11  . Ferrous Sulfate Dried (SLOW RELEASE IRON) 45 MG TBCR Take 1 tablet by mouth daily.    [name] lisinopril (ZESTRIL) 10 MG tablet TAKE 1 TABLET BY MOUTH EVERY DAY 90 tablet 1  . neomycin-polymyxin-hydrocortisone (CORTISPORIN) 3.5-10000-1 OTIC suspension SMARTSIG:In Ear(s)     No facility-administered medications prior to visit.    No Known Allergies  ROS Review of Systems  Objective:    Physical Exam Vitals reviewed.  Constitutional:      Appearance: She is well-developed.  HENT:     Head: Normocephalic and atraumatic.     Right Ear: Tympanic membrane, ear canal and external ear normal.     Left Ear: Tympanic membrane, ear canal and external ear normal.     Nose: Nose normal.     Mouth/Throat:     Mouth: Mucous membranes are moist.     Pharynx: Oropharynx is clear. No oropharyngeal exudate or posterior oropharyngeal erythema.  Eyes:     Conjunctiva/sclera: Conjunctivae normal.     Pupils: Pupils are equal, round, and reactive to light.  Neck:     Thyroid: No thyromegaly.  Cardiovascular:     Rate and Rhythm: Normal rate and regular rhythm.     Heart sounds: Normal heart sounds.  Pulmonary:      Effort: Pulmonary effort is normal.     Breath sounds: Normal breath sounds. No wheezing.  Abdominal:     General: Bowel sounds are normal.     Palpations: There is no mass.     Tenderness: There is no abdominal tenderness.  Musculoskeletal:     Cervical back: Neck supple.  Lymphadenopathy:     Cervical: No cervical adenopathy.  Skin:    General: Skin is warm and dry.     Coloration: Skin is not pale.  Neurological:     Mental Status: She is alert and oriented to person, place, and time.  Psychiatric:        Behavior: Behavior normal.     BP (!) 117/56   Pulse 88   Ht 5\' 4"  (1.626 m)   Wt 253 lb (114.8 kg)   SpO2 100%   BMI 43.43 kg/m  Wt Readings from Last 3 Encounters:  12/10/20 253 lb (114.8 kg)  11/11/19 249 lb (112.9 kg)  11/20/18 242 lb (109.8 kg)     Health Maintenance Due  Topic Date Due  . Hepatitis C Screening  Never done  . PAP SMEAR-Modifier  09/09/2019    There are no preventive care reminders to display for this patient.  Lab Results  Component Value Date   TSH 0.62 04/15/2016   Lab Results  Component Value Date   WBC 4.8 11/11/2019   HGB 8.0 (A) 12/10/2020   HCT 33.4 (L) 11/11/2019   MCV 77.9 (L) 11/11/2019   PLT 282 11/11/2019   Lab Results  Component Value Date   NA 142 11/11/2019   K 3.9 11/11/2019   CO2 26 11/11/2019   GLUCOSE 86 11/11/2019   BUN 15 11/11/2019   CREATININE 0.95 11/11/2019   BILITOT 0.3 11/11/2019   ALKPHOS 63 02/23/2017   AST 17 11/11/2019   ALT 12 11/11/2019   PROT 7.4 11/11/2019   ALBUMIN 4.3 02/23/2017   CALCIUM 9.3 11/11/2019   Lab Results  Component Value Date   CHOL 173 11/11/2019   Lab Results  Component Value Date   HDL 61 11/11/2019   Lab Results  Component Value Date   LDLCALC 97 11/11/2019   Lab Results  Component Value Date   TRIG 64 11/11/2019   Lab Results  Component Value Date   CHOLHDL 2.8 11/11/2019   Lab Results  Component Value Date   HGBA1C 5.7 (H) 11/11/2019       Assessment & Plan:   Problem List Items Addressed This Visit      Endocrine   IFG (impaired fasting glucose)  She is already working with a nutritionist so just encouraged her to speak with them about helping her come up with a low-carb diet with increased veggies.  Plan to recheck again in 6 months.        Musculoskeletal and Integument   Hidradenitis suppurativa     Other   Depression, major, single episode, mild (HCC)   Anemia, iron deficiency    He did do a fingerstick hemoglobin since it was not on her labs that she had done recently it looks like it is actually dropped so would like to get a CBC with iron panel to confirm.  Reports that she has been taking taking Slow Fe daily      Relevant Medications   Ferrous Sulfate Dried (SLOW RELEASE IRON) 45 MG TBCR   Other Relevant Orders   CBC with Differential/Platelet   Fe+TIBC+Fer    Other Visit Diagnoses    Routine general medical examination at a health care facility    -  Primary   Relevant Orders   Tdap vaccine greater than or equal to 7yo IM (Completed)   POCT hemoglobin (Completed)   Need for Tdap vaccination       Relevant Orders   Tdap vaccine greater than or equal to 7yo IM (Completed)   Sleep disturbance         Keep up a regular exercise program and make sure you are eating a healthy diet Try to eat 4 servings of dairy a day, or if you are lactose intolerant take a calcium with vitamin D daily.  Your vaccines are up to date.  Tdap given today.  Sleep disturbance-we discussed that safely she can try things like Unisom, Benadryl, and valerian root capsules.  If these are not helpful we can certainly discuss alternative options also make sure that the sleep environment is dark and cool and quiet and that she tries to go to bed around the same time and get up around the same time and avoid caffeine after lunchtime.  No orders of the defined types were placed in this encounter.   Follow-up: Return in about 6  months (around 06/11/2021) for A1C .    Nani Gasser, MD

## 2020-12-10 NOTE — Patient Instructions (Signed)
Try Valerian Root for sleep. If not helpful then you can try benadryl.

## 2020-12-10 NOTE — Assessment & Plan Note (Addendum)
He did do a fingerstick hemoglobin since it was not on her labs that she had done recently it looks like it is actually dropped so would like to get a CBC with iron panel to confirm.  Reports that she has been taking taking Slow Fe daily

## 2020-12-10 NOTE — Assessment & Plan Note (Signed)
She is already working with a nutritionist so just encouraged her to speak with them about helping her come up with a low-carb diet with increased veggies.  Plan to recheck again in 6 months.

## 2020-12-14 DIAGNOSIS — L732 Hidradenitis suppurativa: Secondary | ICD-10-CM | POA: Insufficient documentation

## 2020-12-17 ENCOUNTER — Encounter: Payer: Self-pay | Admitting: Family Medicine

## 2020-12-24 LAB — CBC WITH DIFFERENTIAL/PLATELET
Absolute Monocytes: 330 cells/uL (ref 200–950)
Basophils Absolute: 9 cells/uL (ref 0–200)
Basophils Relative: 0.2 %
Eosinophils Absolute: 48 cells/uL (ref 15–500)
Eosinophils Relative: 1.1 %
HCT: 34.8 % — ABNORMAL LOW (ref 35.0–45.0)
Hemoglobin: 11.5 g/dL — ABNORMAL LOW (ref 11.7–15.5)
Lymphs Abs: 1734 cells/uL (ref 850–3900)
MCH: 27.6 pg (ref 27.0–33.0)
MCHC: 33 g/dL (ref 32.0–36.0)
MCV: 83.7 fL (ref 80.0–100.0)
MPV: 11.3 fL (ref 7.5–12.5)
Monocytes Relative: 7.5 %
Neutro Abs: 2279 cells/uL (ref 1500–7800)
Neutrophils Relative %: 51.8 %
Platelets: 243 10*3/uL (ref 140–400)
RBC: 4.16 10*6/uL (ref 3.80–5.10)
RDW: 13.2 % (ref 11.0–15.0)
Total Lymphocyte: 39.4 %
WBC: 4.4 10*3/uL (ref 3.8–10.8)

## 2020-12-24 LAB — IRON,TIBC AND FERRITIN PANEL
%SAT: 17 % (calc) (ref 16–45)
Ferritin: 25 ng/mL (ref 16–154)
Iron: 54 ug/dL (ref 40–190)
TIBC: 317 mcg/dL (calc) (ref 250–450)

## 2020-12-24 LAB — HEPATITIS C ANTIBODY
Hepatitis C Ab: NONREACTIVE
SIGNAL TO CUT-OFF: 0.01 (ref <1.00)

## 2021-01-28 ENCOUNTER — Encounter: Payer: Self-pay | Admitting: Family Medicine

## 2021-01-28 DIAGNOSIS — R7301 Impaired fasting glucose: Secondary | ICD-10-CM

## 2021-01-28 NOTE — Telephone Encounter (Signed)
OK for new referral. Thank you

## 2021-02-02 ENCOUNTER — Encounter: Payer: Self-pay | Admitting: Family Medicine

## 2021-03-18 ENCOUNTER — Ambulatory Visit: Payer: No Typology Code available for payment source | Admitting: Registered"

## 2021-05-05 ENCOUNTER — Ambulatory Visit: Payer: No Typology Code available for payment source | Admitting: Registered"

## 2021-05-05 LAB — HM PAP SMEAR: HM Pap smear: NEGATIVE

## 2021-05-06 ENCOUNTER — Other Ambulatory Visit: Payer: Self-pay | Admitting: Family Medicine

## 2021-05-06 DIAGNOSIS — I10 Essential (primary) hypertension: Secondary | ICD-10-CM

## 2021-06-02 ENCOUNTER — Encounter: Payer: No Typology Code available for payment source | Attending: Family Medicine | Admitting: Registered"

## 2021-06-02 ENCOUNTER — Other Ambulatory Visit: Payer: Self-pay

## 2021-06-02 ENCOUNTER — Encounter: Payer: Self-pay | Admitting: Registered"

## 2021-06-02 DIAGNOSIS — R7301 Impaired fasting glucose: Secondary | ICD-10-CM | POA: Diagnosis not present

## 2021-06-02 DIAGNOSIS — Z713 Dietary counseling and surveillance: Secondary | ICD-10-CM

## 2021-06-02 NOTE — Progress Notes (Signed)
Medical Nutrition Therapy  Appointment Start time:  9:19  Appointment End time:  10:12  Primary concerns today: a plan she can stick with   Referral diagnosis: prediabetes Preferred learning style: no preference indicated Learning readiness: ready, change in progress  NUTRITION ASSESSMENT   Pt states her recent A1c was a few months ago and results were slightly elevated. States she has upcoming appt with PCP in 2 weeks. States she has gained about 30 lbs in 4 years since working from home. States she does not want prediabetes to turn into diabetes. Reports family history of diabetes - mom and dad (Type 2 diabetes) and uncle (Type 1 diabetes). States her siblings do not have diabetes/prediabetes that she is aware of.   States she has history of stress eating but has managed it pretty well over time. States she takes time to cook meals during the week for family. Reports she typically skips breakfast. States she does not like oatmeal but will try to eat it.   States she is trying to get better at managing stress/self-care. States she enjoys walking, yoga, and Zumba.   Reports she has 2 children (ages 55 and 11). Works from home Mon-Fri 8:30-5 pm; but typically works 9-10 hours/day, 5 days/week. States she is currently finishing her master's degree.     Clinical Medical Hx: HTN, GERD, chronic constipation, PCOS, anemia Medications: See list Labs: elevated A1c (5.7) Notable Signs/Symptoms: none reported  Lifestyle & Dietary Hx  Estimated daily fluid intake: 70 oz Supplements: See list Sleep: maybe 5 hours/night Stress / self-care: none reported Current average weekly physical activity: walking 30 min, a few times/week; stretching and flexibility challenge with work; sporadically  24-Hr Dietary Recall First Meal (7 am): typically skips; smoothies (frozen fruit, almond milk/OJ, chia seeds, PB, granola) or [employer] - chicken egg white grilled on english muffin (290) Snack:  Second Meal  (10:30 am): sandwich ([place], cheese) + chips Snack (2 pm): 13 fries (cooked in air-fryer) Third Meal (7:30 pm): ribs + roasted broccoli + okra + brown rice Snack (8:30 pm): yogurt Beverages: powerade (20 oz), water (3*16.9 oz; 50 oz); 70 oz   NUTRITION DIAGNOSIS  NB-1.1 Food and nutrition-related knowledge deficit As related to prediabetes.  As evidenced by some lack of previous nutrition-related education.   NUTRITION INTERVENTION  Nutrition education (E-1) on the following topics: Pt was educated and counseled on metabolism, importance of not skipping meals, prediabetes/diabetes risk factors, role of fiber in eating regimen, and importance of physical activity. Discussed meal/snack planning and how to balance meals/snacks using MyPlate for prediabetes. Pt agreed with goals listed.  Handouts Provided Include  MyPlate for Prediabetes MyPlate weekly planner  Learning Style & Readiness for Change Teaching method utilized: Visual & Auditory  Demonstrated degree of understanding via: Teach Back  Barriers to learning/adherence to lifestyle change: work-life balance  Goals Established by Pt Aim to have breakfast daily to include at least starch and protein [place] yogurt Scrambled eggs + peppers/onions + fruit  Toast + nut butter Chicken + avocado toast Plan meals on the weekends for family. Use handout provided.  Check out MyPlate meal planner [email or web address] Add in physical activity for at least 20-30 min, 2 days/week.  Aim to have 1/2 plate of non-starchy vegetables with lunch and dinner.    MONITORING & EVALUATION Dietary intake, weekly physical activity.  Next Steps  Patient is to follow-up prn.

## 2021-06-02 NOTE — Patient Instructions (Addendum)
-   Aim to have breakfast daily to include at least starch and protein [place] yogurt Scrambled eggs + peppers/onions + fruit  Toast + nut butter Chicken + avocado toast  - Plan meals on the weekends for family. Use handout provided.  Check out MyPlate meal planner [email or web address]  - Add in physical activity for at least 20-30 min, 2 days/week.   - Aim to have 1/2 plate of non-starchy vegetables with lunch and dinner.

## 2021-06-11 ENCOUNTER — Ambulatory Visit: Payer: No Typology Code available for payment source | Admitting: Family Medicine

## 2021-06-11 ENCOUNTER — Encounter: Payer: Self-pay | Admitting: Family Medicine

## 2021-06-11 ENCOUNTER — Other Ambulatory Visit: Payer: Self-pay

## 2021-06-11 VITALS — BP 113/64 | HR 87 | Ht 64.0 in | Wt 243.0 lb

## 2021-06-11 DIAGNOSIS — Z23 Encounter for immunization: Secondary | ICD-10-CM | POA: Diagnosis not present

## 2021-06-11 DIAGNOSIS — R7301 Impaired fasting glucose: Secondary | ICD-10-CM | POA: Diagnosis not present

## 2021-06-11 DIAGNOSIS — I10 Essential (primary) hypertension: Secondary | ICD-10-CM | POA: Diagnosis not present

## 2021-06-11 LAB — POCT GLYCOSYLATED HEMOGLOBIN (HGB A1C): Hemoglobin A1C: 5.7 % — AB (ref 4.0–5.6)

## 2021-06-11 NOTE — Progress Notes (Signed)
Established Patient Office Visit  Subjective:  Patient ID: Katherine [name], female    DOB: 10-31-1980  Age: 40 y.o. MRN: [id number]  CC: No chief complaint on file.   HPI Katherine [name] presents for   Impaired fasting glucose-no increased thirst or urination. No symptoms consistent with hypoglycemia.  She has been really working on some dietary changes and is doing an online exercise program/group.  She has been really working at it.  Hypertension- Pt denies chest pain, SOB, dizziness, or heart palpitations.  Taking meds as directed w/o problems.  Denies medication side effects.      Past Medical History:  Diagnosis Date   Anemia    Hypertension    Polycystic ovarian disease    Polycystic ovarian syndrome     Past Surgical History:  Procedure Laterality Date   CESAREAN SECTION  February 2011   CESAREAN SECTION  02/22/2012   Procedure: CESAREAN SECTION;  Surgeon: Zelphia Cairo, MD;  Location: [facility];  Service: Gynecology;  Laterality: N/A;   Hysterosalpingogram  2010    Family History  Problem Relation Age of Onset   Fibromyalgia Mother    Diabetes Mother    Diabetes Father    Kidney failure Father        secondary to DM   Alcohol abuse Maternal Uncle    Diabetes Maternal Uncle    Alcohol abuse Maternal Grandfather    Hypertension Other    Cancer Other     Social History   Socioeconomic History   Marital status: Married    Spouse name: Terrell   Number of children: 2   Years of education: Not on file   Highest education level: Not on file  Occupational History   Occupation: Teacher, adult education: [employer]    Comment: Care Management  Tobacco Use   Smoking status: Never   Smokeless tobacco: Never  Substance and Sexual Activity   Alcohol use: No   Drug use: No   Sexual activity: Yes    Partners: Male    Comment: [facility] OR, married, regularly exercises.  Other Topics Concern   Not on file  Social History Narrative   0 caffeine drink per day.  Some regular exercise.   Social Determinants of Corporate investment banker Strain: Not on file  Food Insecurity: No Food Insecurity   Worried About Programme researcher, broadcasting/film/video in the Last Year: Never true   Ran Out of Food in the Last Year: Never true  Transportation Needs: Not on file  Physical Activity: Not on file  Stress: Not on file  Social Connections: Not on file  Intimate Partner Violence: Not on file    Outpatient Medications Prior to Visit  Medication Sig Dispense Refill   clindamycin (CLEOCIN T) 1 % external solution Apply topically 2 (two) times daily.  11   Ferrous Sulfate Dried (SLOW RELEASE IRON) 45 MG TBCR Take 1 tablet by mouth daily.     lisinopril (ZESTRIL) 10 MG tablet TAKE 1 TABLET BY MOUTH EVERY DAY 90 tablet 1   No facility-administered medications prior to visit.    No Known Allergies  ROS Review of Systems    Objective:    Physical Exam Constitutional:      Appearance: Normal appearance. She is well-developed.  HENT:     Head: Normocephalic and atraumatic.  Cardiovascular:     Rate and Rhythm: Normal rate and regular rhythm.     Heart sounds: Normal heart sounds.  Pulmonary:     Effort: Pulmonary effort is normal.     Breath sounds: Normal breath sounds.  Skin:    General: Skin is warm and dry.  Neurological:     Mental Status: She is alert and oriented to person, place, and time.  Psychiatric:        Behavior: Behavior normal.    BP 113/64   Pulse 87   Ht 5\' 4"  (1.626 m)   Wt 243 lb (110.2 kg)   SpO2 100%   BMI 41.71 kg/m  Wt Readings from Last 3 Encounters:  06/11/21 243 lb (110.2 kg)  12/10/20 253 lb (114.8 kg)  11/11/19 249 lb (112.9 kg)     Health Maintenance Due  Topic Date Due   PAP SMEAR-Modifier  09/09/2019   COVID-19 Vaccine (4 - Booster) 09/24/2020   INFLUENZA VACCINE  03/22/2021    There are no preventive care reminders to display for this patient.  Lab Results  Component Value Date   TSH 0.62 04/15/2016   Lab  Results  Component Value Date   WBC 4.4 12/23/2020   HGB 11.5 (L) 12/23/2020   HCT 34.8 (L) 12/23/2020   MCV 83.7 12/23/2020   PLT 243 12/23/2020   Lab Results  Component Value Date   NA 142 11/11/2019   K 3.9 11/11/2019   CO2 26 11/11/2019   GLUCOSE 86 11/11/2019   BUN 15 11/11/2019   CREATININE 0.95 11/11/2019   BILITOT 0.3 11/11/2019   ALKPHOS 52 11/11/2019   AST 17 11/11/2019   ALT 12 11/11/2019   PROT 7.4 11/11/2019   ALBUMIN 4.3 02/23/2017   CALCIUM 9.3 11/11/2019   Lab Results  Component Value Date   CHOL 198 12/04/2020   Lab Results  Component Value Date   HDL 65 12/04/2020   Lab Results  Component Value Date   LDLCALC 114 12/04/2020   Lab Results  Component Value Date   TRIG 93 12/04/2020   Lab Results  Component Value Date   CHOLHDL 2.8 11/11/2019   Lab Results  Component Value Date   HGBA1C 5.7 (A) 06/11/2021      Assessment & Plan:   Problem List Items Addressed This Visit       Cardiovascular and Mediastinum   Essential hypertension    Bp is great. OK to dec lisinopril to 5mg . F/U in 6 week.         Endocrine   IFG (impaired fasting glucose) - Primary    1C is stable at 5.7 she has done a great job with getting more active, exercising regularly. .  She has lost 10 pounds.      Relevant Orders   POCT glycosylated hemoglobin (Hb A1C) (Completed)    No orders of the defined types were placed in this encounter.   Follow-up: Return in about 6 months (around 12/10/2021) for Pre-diabetes.    , MD

## 2021-06-11 NOTE — Assessment & Plan Note (Signed)
Bp is great. OK to dec lisinopril to 5mg . F/U in 6 week.

## 2021-06-11 NOTE — Patient Instructions (Signed)
Ok to decrease to 5mg  of the lisinopril.

## 2021-06-11 NOTE — Assessment & Plan Note (Addendum)
1C is stable at 5.7 she has done a great job with getting more active, exercising regularly. .  She has lost 10 pounds.

## 2021-10-24 ENCOUNTER — Other Ambulatory Visit: Payer: Self-pay | Admitting: Family Medicine

## 2021-10-24 DIAGNOSIS — I10 Essential (primary) hypertension: Secondary | ICD-10-CM

## 2021-10-26 ENCOUNTER — Encounter: Payer: Self-pay | Admitting: Family Medicine

## 2021-10-26 DIAGNOSIS — I10 Essential (primary) hypertension: Secondary | ICD-10-CM

## 2021-10-26 MED ORDER — LISINOPRIL 5 MG PO TABS: 5.0000 mg | ORAL_TABLET | Freq: Every day | ORAL | 3 refills | Status: DC

## 2021-10-26 NOTE — Telephone Encounter (Signed)
New rx sent for 10mg  dose.   ? ?Meds ordered this encounter  ?Medications  ? lisinopril (ZESTRIL) 5 MG tablet  ?  Sig: Take 1 tablet (5 mg total) by mouth daily.  ?  Dispense:  90 tablet  ?  Refill:  3  ? ? ?

## 2021-12-13 ENCOUNTER — Encounter: Payer: Self-pay | Admitting: Family Medicine

## 2021-12-13 ENCOUNTER — Ambulatory Visit: Payer: No Typology Code available for payment source | Admitting: Family Medicine

## 2021-12-13 VITALS — BP 117/65 | HR 73 | Ht 64.0 in | Wt 244.0 lb

## 2021-12-13 DIAGNOSIS — I10 Essential (primary) hypertension: Secondary | ICD-10-CM | POA: Diagnosis not present

## 2021-12-13 DIAGNOSIS — D509 Iron deficiency anemia, unspecified: Secondary | ICD-10-CM

## 2021-12-13 DIAGNOSIS — Z0000 Encounter for general adult medical examination without abnormal findings: Secondary | ICD-10-CM

## 2021-12-13 DIAGNOSIS — R7301 Impaired fasting glucose: Secondary | ICD-10-CM

## 2021-12-13 NOTE — Progress Notes (Signed)
? ?Complete physical exam ? ?Patient: Katherine [name]   DOB: 20-May-1981   41 y.o. Female  MRN: [id number] ? ?Subjective:  ?  ?Chief Complaint  ?Patient presents with  ? Annual Exam  ? ? ?Katherine [name] is a 41 y.o. female who presents today for a complete physical exam. She reports consuming a general diet. Home exercise routine includes occ walking. She generally feels well. She does not have additional problems to discuss today.  ? ?seeing dermatology for her adenitis suppurativa. ? ?Most recent fall risk assessment: ? ?  12/13/2021  ?  9:53 AM  ?Fall Risk   ?Falls in the past year? 0  ?Number falls in past yr: 0  ?Injury with Fall? 0  ?Risk for fall due to : No Fall Risks  ?Follow up Falls prevention discussed  ? ?  ?Most recent depression screenings: ? ?  12/13/2021  ?  9:53 AM 06/11/2021  ?  4:44 PM  ?PHQ 2/9 Scores  ?PHQ - 2 Score 2 0  ?PHQ- 9 Score 10   ? ? ? ? ?Past Surgical History:  ?Procedure Laterality Date  ? CESAREAN SECTION  February 2011  ? CESAREAN SECTION  02/22/2012  ? Procedure: CESAREAN SECTION;  Surgeon: Marylynn Pearson, MD;  Location: [facility];  Service: Gynecology;  Laterality: N/A;  ? Hysterosalpingogram  2010  ? ?Social History  ? ?Tobacco Use  ? Smoking status: Never  ? Smokeless tobacco: Never  ?Substance Use Topics  ? Alcohol use: No  ? Drug use: No  ? ?Family History  ?Problem Relation Age of Onset  ? Fibromyalgia Mother   ? Diabetes Mother   ? Diabetes Father   ? Kidney failure Father   ?     secondary to DM  ? Alcohol abuse Maternal Uncle   ? Diabetes Maternal Uncle   ? Alcohol abuse Maternal Grandfather   ? Hypertension Other   ? Cancer Other   ? ?No Known Allergies ?  ? ?Patient Care Team: ?Hali Marry, MD as PCP - General (Family Medicine) ?Marylynn Pearson, MD (Obstetrics and Gynecology)  ? ?Outpatient Medications Prior to Visit  ?Medication Sig  ? Blood Pressure Monitoring Soln KIT   ? clindamycin (CLEOCIN T) 1 % external solution Apply topically 2 (two) times daily.   ? Ferrous Sulfate Dried (SLOW RELEASE IRON) 45 MG TBCR Take 1 tablet by mouth daily.  ? lisinopril (ZESTRIL) 5 MG tablet Take 1 tablet (5 mg total) by mouth daily.  ? ?No facility-administered medications prior to visit.  ? ? ?ROS ? ? ? ? ?   ?Objective:  ? ?  ?BP 117/65   Pulse 73   Ht 5' 4" (1.626 m)   Wt 244 lb (110.7 kg)   SpO2 98%   BMI 41.88 kg/m?  ?  ? ?Physical Exam ?Vitals and nursing note reviewed.  ?Constitutional:   ?   Appearance: She is well-developed.  ?HENT:  ?   Head: Normocephalic and atraumatic.  ?   Right Ear: External ear normal.  ?   Left Ear: External ear normal.  ?   Ears:  ?   Comments: Right TM blocked by cerumen ?   Nose: Nose normal.  ?Eyes:  ?   Conjunctiva/sclera: Conjunctivae normal.  ?   Pupils: Pupils are equal, round, and reactive to light.  ?Neck:  ?   Thyroid: No thyromegaly.  ?Cardiovascular:  ?   Rate and Rhythm: Normal rate and regular rhythm.  ?  Heart sounds: Normal heart sounds.  ?Pulmonary:  ?   Effort: Pulmonary effort is normal.  ?   Breath sounds: Normal breath sounds. No wheezing.  ?Abdominal:  ?   General: Bowel sounds are normal.  ?   Palpations: Abdomen is soft.  ?   Tenderness: There is no abdominal tenderness.  ?Musculoskeletal:  ?   Cervical back: Neck supple.  ?Lymphadenopathy:  ?   Cervical: No cervical adenopathy.  ?Skin: ?   General: Skin is warm and dry.  ?   Coloration: Skin is not pale.  ?Neurological:  ?   Mental Status: She is alert and oriented to person, place, and time.  ?Psychiatric:     ?   Behavior: Behavior normal.  ?  ? ?No results found for any visits on 12/13/21. ?Last hemoglobin A1c ?Lab Results  ?Component Value Date  ? HGBA1C 5.7 (A) 06/11/2021  ? ?  ?   ?Assessment & Plan:  ?  ?Routine Health Maintenance and Physical Exam ? ?Immunization History  ?Administered Date(s) Administered  ? Influenza Inj Mdck Quad Pf 05/07/2019  ? Influenza Inj Mdck Quad With Preservative 06/30/2018  ? Influenza Split 05/22/2012  ? Influenza Whole 06/16/2010   ? Influenza, Seasonal, Injecte, Preservative Fre 05/28/2013  ? Influenza,inj,Quad PF,6+ Mos 07/30/2020, 06/11/2021  ? Influenza-Unspecified 05/30/2014, 05/18/2017, 06/30/2018  ? PFIZER(Purple Top)SARS-COV-2 Vaccination 11/09/2019, 07/30/2020  ? Td 02/02/2000, 05/17/2010  ? Tdap 12/10/2020  ? Unspecified SARS-COV-2 Vaccination 11/09/2019, 11/30/2019  ? ? ?Health Maintenance  ?Topic Date Due  ? COVID-19 Vaccine (5 - Booster) 09/24/2020  ? INFLUENZA VACCINE  03/22/2022  ? PAP SMEAR-Modifier  05/05/2024  ? TETANUS/TDAP  12/11/2030  ? Hepatitis C Screening  Completed  ? HIV Screening  Completed  ? Pneumococcal Vaccine 70-60 Years old  Aged Out  ? HPV VACCINES  Aged Out  ? ? ?Discussed health benefits of physical activity, and encouraged her to engage in regular exercise appropriate for her age and condition. ? ?Problem List Items Addressed This Visit   ? ?  ? Cardiovascular and Mediastinum  ? Essential hypertension - Primary  ? Relevant Orders  ? Lipid Panel w/reflex Direct LDL  ? COMPLETE METABOLIC PANEL WITH GFR  ? CBC  ? Fe+TIBC+Fer  ?  ? Endocrine  ? IFG (impaired fasting glucose)  ?  A1C looks great today!!!!  ? ?  ?  ?  ? Other  ? Anemia, iron deficiency  ? Relevant Orders  ? Fe+TIBC+Fer  ? ?Other Visit Diagnoses   ? ? Wellness examination      ? Relevant Orders  ? Lipid Panel w/reflex Direct LDL  ? COMPLETE METABOLIC PANEL WITH GFR  ? CBC  ? Fe+TIBC+Fer  ? ?  ? ?Keep up a regular exercise program and make sure you are eating a healthy diet ?Try to eat 4 servings of dairy a day, or if you are lactose intolerant take a calcium with vitamin D daily.  ?Your vaccines are up to date.  ?Due for updated lab work.  Though A1c looks phenomenal today she is really worked hard to make some dietary changes. ? ? ? ?Return in about 6 months (around 06/14/2022) for Hypertension. ? ?  ? ?Beatrice Lecher, MD ? ? ?

## 2021-12-13 NOTE — Assessment & Plan Note (Signed)
A1C looks great today!!!!  ?

## 2021-12-14 LAB — LIPID PANEL W/REFLEX DIRECT LDL
Cholesterol: 171 mg/dL (ref <200)
HDL: 65 mg/dL (ref >=50)
LDL Cholesterol (Calc): 90 mg/dL (calc)
Non-HDL Cholesterol (Calc): 106 mg/dL (calc) (ref <130)
Total CHOL/HDL Ratio: 2.6 (calc) (ref <5.0)
Triglycerides: 73 mg/dL (ref <150)

## 2021-12-14 LAB — IRON,TIBC AND FERRITIN PANEL
%SAT: 12 % (calc) — ABNORMAL LOW (ref 16–45)
Ferritin: 8 ng/mL — ABNORMAL LOW (ref 16–154)
Iron: 47 ug/dL (ref 40–190)
TIBC: 381 mcg/dL (calc) (ref 250–450)

## 2021-12-14 LAB — CBC
HCT: 36 % (ref 35.0–45.0)
Hemoglobin: 11.5 g/dL — ABNORMAL LOW (ref 11.7–15.5)
MCH: 26.3 pg — ABNORMAL LOW (ref 27.0–33.0)
MCHC: 31.9 g/dL — ABNORMAL LOW (ref 32.0–36.0)
MCV: 82.4 fL (ref 80.0–100.0)
MPV: 11.3 fL (ref 7.5–12.5)
Platelets: 296 10*3/uL (ref 140–400)
RBC: 4.37 10*6/uL (ref 3.80–5.10)
RDW: 13.7 % (ref 11.0–15.0)
WBC: 5 10*3/uL (ref 3.8–10.8)

## 2021-12-14 LAB — COMPLETE METABOLIC PANEL WITH GFR
AG Ratio: 1.5 (calc) (ref 1.0–2.5)
ALT: 10 U/L (ref 6–29)
AST: 15 U/L (ref 10–30)
Albumin: 4.4 g/dL (ref 3.6–5.1)
Alkaline phosphatase (APISO): 48 U/L (ref 31–125)
BUN: 13 mg/dL (ref 7–25)
CO2: 25 mmol/L (ref 20–32)
Calcium: 9.7 mg/dL (ref 8.6–10.2)
Chloride: 106 mmol/L (ref 98–110)
Creat: 0.95 mg/dL (ref 0.50–0.97)
Globulin: 3 g/dL (calc) (ref 1.9–3.7)
Glucose, Bld: 80 mg/dL (ref 65–99)
Potassium: 4.4 mmol/L (ref 3.5–5.3)
Sodium: 139 mmol/L (ref 135–146)
Total Bilirubin: 0.3 mg/dL (ref 0.2–1.2)
Total Protein: 7.4 g/dL (ref 6.1–8.1)
eGFR: 78 mL/min/{1.73_m2} (ref >=60)

## 2021-12-14 NOTE — Progress Notes (Signed)
Yes, that is okay.  If she can take it twice a day that would be best but if it causes constipation and she can only take it once a day then that is fine.

## 2021-12-14 NOTE — Progress Notes (Signed)
Hi Katherine [name], hemoglobin looks stable at 11.5 this exactly what it was back in May of last year.  Still a little borderline anemic though.  Total iron is on the low end as well as your ferritin stores.  Goal would be for them to be greater than 40 they were 25 back in May of last year and they are now down to 8.  So just encourage you to continue taking your extra iron and working on eating an iron rich diet.  Cholesterol looks great.  Metabolic panel also looks great!

## 2022-05-17 ENCOUNTER — Encounter: Payer: Self-pay | Admitting: Family Medicine

## 2022-06-14 NOTE — Progress Notes (Unsigned)
   Established Patient Office Visit  Subjective   Patient ID: Katherine [name], female    DOB: 09-May-1981  Age: 41 y.o. MRN: [id number]  No chief complaint on file.   HPI  Hypertension- Pt denies chest pain, SOB, dizziness, or heart palpitations.  Taking meds as directed w/o problems.  Denies medication side effects.    Impaired fasting glucose-no increased thirst or urination. No symptoms consistent with hypoglycemia.   {History (Optional):23778}  ROS    Objective:     There were no vitals taken for this visit. {Vitals History (Optional):23777}  Physical Exam Vitals and nursing note reviewed.  Constitutional:      Appearance: She is well-developed.  HENT:     Head: Normocephalic and atraumatic.  Cardiovascular:     Rate and Rhythm: Normal rate and regular rhythm.     Heart sounds: Normal heart sounds.  Pulmonary:     Effort: Pulmonary effort is normal.     Breath sounds: Normal breath sounds.  Skin:    General: Skin is warm and dry.  Neurological:     Mental Status: She is alert and oriented to person, place, and time.  Psychiatric:        Behavior: Behavior normal.    No results found for any visits on 06/15/22.  {Labs (Optional):23779}  The 10-year ASCVD risk score (Arnett DK, et al., 2019) is: 0.5%    Assessment & Plan:   Problem List Items Addressed This Visit       Cardiovascular and Mediastinum   Essential hypertension - Primary     Endocrine   IFG (impaired fasting glucose)     Other   Anemia, iron deficiency    No follow-ups on file.    Beatrice Lecher, MD

## 2022-06-15 ENCOUNTER — Ambulatory Visit: Payer: No Typology Code available for payment source | Admitting: Family Medicine

## 2022-06-15 ENCOUNTER — Encounter: Payer: Self-pay | Admitting: Family Medicine

## 2022-06-15 VITALS — BP 122/60 | HR 75 | Ht 64.0 in | Wt 235.0 lb

## 2022-06-15 DIAGNOSIS — R7301 Impaired fasting glucose: Secondary | ICD-10-CM | POA: Diagnosis not present

## 2022-06-15 DIAGNOSIS — Z23 Encounter for immunization: Secondary | ICD-10-CM

## 2022-06-15 DIAGNOSIS — D509 Iron deficiency anemia, unspecified: Secondary | ICD-10-CM

## 2022-06-15 DIAGNOSIS — I10 Essential (primary) hypertension: Secondary | ICD-10-CM

## 2022-06-15 LAB — POCT GLYCOSYLATED HEMOGLOBIN (HGB A1C): Hemoglobin A1C: 5.3 % (ref 4.0–5.6)

## 2022-06-15 NOTE — Assessment & Plan Note (Signed)
Ok to stop the lisinopril. Can monitor at home.  Can restart if needed.

## 2022-06-15 NOTE — Assessment & Plan Note (Signed)
Due to recheck iron. 

## 2022-06-15 NOTE — Assessment & Plan Note (Signed)
A!C looks phenomenol at 5.3.

## 2022-06-16 LAB — IRON,TIBC AND FERRITIN PANEL
%SAT: 12 % (calc) — ABNORMAL LOW (ref 16–45)
Ferritin: 12 ng/mL — ABNORMAL LOW (ref 16–154)
Iron: 45 ug/dL (ref 40–190)
TIBC: 370 mcg/dL (calc) (ref 250–450)

## 2022-06-16 NOTE — Progress Notes (Signed)
HI Shana, iron level is still low at 12 but better than it was 6 months ago so we are making some progress.  I encouraged her to continue with the iron supplement as best as she can.  Try to continue to work on getting iron rich foods in the diet and foods that are fortified with extra iron.  And then's plan to recheck again in about 4 months.

## 2022-06-21 LAB — HM MAMMOGRAPHY

## 2022-07-25 ENCOUNTER — Encounter: Payer: Self-pay | Admitting: Family Medicine

## 2022-12-21 ENCOUNTER — Ambulatory Visit: Payer: No Typology Code available for payment source | Admitting: Family Medicine

## 2022-12-26 ENCOUNTER — Ambulatory Visit: Payer: No Typology Code available for payment source | Admitting: Family Medicine

## 2022-12-26 VITALS — BP 120/69 | HR 88 | Ht 64.0 in | Wt 238.0 lb

## 2022-12-26 DIAGNOSIS — I10 Essential (primary) hypertension: Secondary | ICD-10-CM | POA: Diagnosis not present

## 2022-12-26 DIAGNOSIS — R7301 Impaired fasting glucose: Secondary | ICD-10-CM | POA: Diagnosis not present

## 2022-12-26 DIAGNOSIS — D509 Iron deficiency anemia, unspecified: Secondary | ICD-10-CM

## 2022-12-26 DIAGNOSIS — L732 Hidradenitis suppurativa: Secondary | ICD-10-CM

## 2022-12-26 LAB — POCT GLYCOSYLATED HEMOGLOBIN (HGB A1C): Hemoglobin A1C: 5.6 % (ref 4.0–5.6)

## 2022-12-26 NOTE — Assessment & Plan Note (Signed)
Slightly started on spironolactone by dermatology.

## 2022-12-26 NOTE — Assessment & Plan Note (Signed)
Pressure looks great today she is off of the lisinopril but she is on spironolactone for her PCOS this was recently started by the dermatologist.  She says she is been tolerating it well without any problems.

## 2022-12-26 NOTE — Assessment & Plan Note (Signed)
Is not been as consistent with her iron lately but says she plans on getting back on track she is going to try to take at the same time she is taking the spironolactone.

## 2022-12-26 NOTE — Assessment & Plan Note (Signed)
1C looks great today at 5.6, though up just slightly from 5.3 back in the fall.  Follow-up again in 6 months.

## 2022-12-26 NOTE — Progress Notes (Signed)
Established Patient Office Visit  Subjective   Patient ID: Katherine [name], female    DOB: Nov 17, 1980  Age: 42 y.o. MRN: [id number]  Chief Complaint  Patient presents with   Hypertension   ifg    HPI  Hypertension- Pt denies chest pain, SOB, dizziness, or heart palpitations.  Taking meds as directed w/o problems.  Denies medication side effects.    Impaired fasting glucose-no increased thirst or urination. No symptoms consistent with hypoglycemia.  She has been working with a therapist via video visit and has found it really helpful she is been having some difficulty with sleep sometimes hard to fall asleep.  She is been try to do some things to help her relax in the evenings and has been listening to some sleep noise before bedtime.     ROS    Objective:     BP 120/69   Pulse 88   Ht 5\' 4"  (1.626 m)   Wt 238 lb (108 kg)   SpO2 98%   BMI 40.85 kg/m    Physical Exam Vitals and nursing note reviewed.  Constitutional:      Appearance: She is well-developed.  HENT:     Head: Normocephalic and atraumatic.  Cardiovascular:     Rate and Rhythm: Normal rate and regular rhythm.     Heart sounds: Normal heart sounds.  Pulmonary:     Effort: Pulmonary effort is normal.     Breath sounds: Normal breath sounds.  Skin:    General: Skin is warm and dry.  Neurological:     Mental Status: She is alert and oriented to person, place, and time.  Psychiatric:        Behavior: Behavior normal.      Results for orders placed or performed in visit on 12/26/22  POCT glycosylated hemoglobin (Hb A1C)  Result Value Ref Range   Hemoglobin A1C 5.6 4.0 - 5.6 %   HbA1c POC (<> result, manual entry)     HbA1c, POC (prediabetic range)     HbA1c, POC (controlled diabetic range)        The 10-year ASCVD risk score (Arnett DK, et al., 2019) is: 0.6%    Assessment & Plan:   Problem List Items Addressed This Visit       Cardiovascular and Mediastinum   Essential  hypertension    Pressure looks great today she is off of the lisinopril but she is on spironolactone for her PCOS this was recently started by the dermatologist.  She says she is been tolerating it well without any problems.      Relevant Medications   spironolactone (ALDACTONE) 50 MG tablet     Endocrine   IFG (impaired fasting glucose) - Primary    1C looks great today at 5.6, though up just slightly from 5.3 back in the fall.  Follow-up again in 6 months.      Relevant Orders   POCT glycosylated hemoglobin (Hb A1C) (Completed)     Musculoskeletal and Integument   Hidradenitis suppurativa    Slightly started on spironolactone by dermatology.        Other   Anemia, iron deficiency    Is not been as consistent with her iron lately but says she plans on getting back on track she is going to try to take at the same time she is taking the spironolactone.       Return in about 27 weeks (around 07/03/2023) for Pre-diabetes.    Nani Gasser, MD

## 2023-01-11 ENCOUNTER — Encounter: Payer: No Typology Code available for payment source | Admitting: Sports Medicine

## 2023-01-13 ENCOUNTER — Ambulatory Visit: Payer: No Typology Code available for payment source | Admitting: Sports Medicine

## 2023-01-13 ENCOUNTER — Ambulatory Visit: Payer: No Typology Code available for payment source

## 2023-01-13 DIAGNOSIS — M778 Other enthesopathies, not elsewhere classified: Secondary | ICD-10-CM | POA: Diagnosis not present

## 2023-01-13 DIAGNOSIS — M25531 Pain in right wrist: Secondary | ICD-10-CM

## 2023-01-13 MED ORDER — MELOXICAM 15 MG PO TABS: ORAL_TABLET | ORAL | 3 refills | Status: DC

## 2023-01-13 NOTE — Progress Notes (Signed)
    Procedures performed today:    None.  Independent interpretation of notes and tests performed by another provider:   None.  Brief History, Exam, Impression, and Recommendations:    Extensor carpi ulnaris tendinitis, right Pleasant 42 year old female, she has had about 2 weeks of right wrist pain ulnar aspect with occasional popping, no trauma. On exam she has tenderness directly over the extensor carpi ulnaris and sixth extensor compartment without any palpable subluxation. Suspect ECU tendinitis. Adding x-rays, meloxicam, she will wear a Velcro brace and I would like her to do some home physical therapy, return to see me about 4 weeks, extensor carpi ulnaris tendon sheath injection if not better.    ____________________________________________ Ihor Austin. Benjamin Stain, M.D., ABFM., CAQSM., AME. Primary Care and Sports Medicine [facility]  Adjunct Professor of Family Medicine  [employer] [facility] [employer]  Restaurant manager, fast food

## 2023-01-13 NOTE — Assessment & Plan Note (Signed)
Pleasant 42 year old female, she has had about 2 weeks of right wrist pain ulnar aspect with occasional popping, no trauma. On exam she has tenderness directly over the extensor carpi ulnaris and sixth extensor compartment without any palpable subluxation. Suspect ECU tendinitis. Adding x-rays, meloxicam, she will wear a Velcro brace and I would like her to do some home physical therapy, return to see me about 4 weeks, extensor carpi ulnaris tendon sheath injection if not better.

## 2023-02-10 ENCOUNTER — Ambulatory Visit: Payer: No Typology Code available for payment source | Admitting: Sports Medicine

## 2023-02-10 DIAGNOSIS — M778 Other enthesopathies, not elsewhere classified: Secondary | ICD-10-CM | POA: Diagnosis not present

## 2023-02-10 NOTE — Progress Notes (Signed)
    Procedures performed today:    None.  Independent interpretation of notes and tests performed by another provider:   None.  Brief History, Exam, Impression, and Recommendations:    Extensor carpi ulnaris tendinitis, right This very pleasant 42 year old female returns, we diagnosed her with extensor carpi ulnaris tendinitis, we treated her conservatively with a Velcro brace, home conditioning, meloxicam, she returns today completely resolved, return as needed.    ____________________________________________ Ihor Austin. Benjamin Stain, M.D., ABFM., CAQSM., AME. Primary Care and Sports Medicine [facility]  Adjunct Professor of Family Medicine  [employer] [facility] [employer]  Restaurant manager, fast food

## 2023-02-10 NOTE — Assessment & Plan Note (Signed)
This very pleasant 42 year old female returns, we diagnosed her with extensor carpi ulnaris tendinitis, we treated her conservatively with a Velcro brace, home conditioning, meloxicam, she returns today completely resolved, return as needed.

## 2023-04-25 ENCOUNTER — Encounter: Payer: Self-pay | Admitting: Family Medicine

## 2023-05-29 ENCOUNTER — Encounter: Payer: Self-pay | Admitting: Family Medicine

## 2023-05-29 DIAGNOSIS — F418 Other specified anxiety disorders: Secondary | ICD-10-CM

## 2023-05-30 MED ORDER — ESCITALOPRAM OXALATE 10 MG PO TABS
ORAL_TABLET | ORAL | 3 refills | Status: DC
Start: 2023-05-30 — End: 2023-07-03

## 2023-06-22 ENCOUNTER — Other Ambulatory Visit: Payer: Self-pay | Admitting: Family Medicine

## 2023-06-22 DIAGNOSIS — F418 Other specified anxiety disorders: Secondary | ICD-10-CM

## 2023-07-03 ENCOUNTER — Ambulatory Visit: Payer: No Typology Code available for payment source | Admitting: Family Medicine

## 2023-07-03 DIAGNOSIS — R7301 Impaired fasting glucose: Secondary | ICD-10-CM

## 2023-09-12 ENCOUNTER — Telehealth: Payer: Self-pay

## 2023-09-12 ENCOUNTER — Encounter: Payer: Self-pay | Admitting: Family Medicine

## 2023-09-12 ENCOUNTER — Ambulatory Visit (INDEPENDENT_AMBULATORY_CARE_PROVIDER_SITE_OTHER): Payer: Managed Care, Other (non HMO) | Admitting: Family Medicine

## 2023-09-12 VITALS — BP 130/61 | HR 86 | Ht 64.0 in | Wt 253.2 lb

## 2023-09-12 DIAGNOSIS — R7301 Impaired fasting glucose: Secondary | ICD-10-CM | POA: Diagnosis not present

## 2023-09-12 DIAGNOSIS — I10 Essential (primary) hypertension: Secondary | ICD-10-CM | POA: Diagnosis not present

## 2023-09-12 DIAGNOSIS — E282 Polycystic ovarian syndrome: Secondary | ICD-10-CM

## 2023-09-12 LAB — POCT GLYCOSYLATED HEMOGLOBIN (HGB A1C): Hemoglobin A1C: 5.6 % (ref 4.0–5.6)

## 2023-09-12 NOTE — Assessment & Plan Note (Signed)
Not currently on medication. Will monitor A1c regularly.

## 2023-09-12 NOTE — Progress Notes (Signed)
   Established Patient Office Visit  Subjective  Patient ID: Katherine [name], female    DOB: [birth year]  Age: 43 y.o. MRN: [id number]  Chief Complaint  Patient presents with   Hypertension   IFG    HPI  Follow-up depression/anxiety-in October she reached out via MyChart and let me know that she was interested in going ahead and starting medication.  Sent in a prescription of Lexapro for her to start.  She says around that time she had been laid off and was just under a lot of stress.  She ended up never starting the Lexapro but worked with her counselor and then ended up finding a new job.  She is actually doing well.  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  Hypertension- Pt denies chest pain, SOB, dizziness, or heart palpitations.  Taking meds as directed w/o problems.  Denies medication side effects.    Impaired fasting glucose-no increased thirst or urination. No symptoms consistent with hypoglycemia.  Did end up stopping the spironolactone it was started initially by dermatology.     ROS    Objective:     BP 130/61 (BP Location: Left Arm, Patient Position: Sitting, Cuff Size: Large)   Pulse 86   Ht 5\' 4"  (1.626 m)   Wt 253 lb 4 oz (114.9 kg)   SpO2 100%   BMI 43.47 kg/m    Physical Exam Vitals and nursing note reviewed.  Constitutional:      Appearance: Normal appearance.  HENT:     Head: Normocephalic and atraumatic.  Eyes:     Conjunctiva/sclera: Conjunctivae normal.  Cardiovascular:     Rate and Rhythm: Normal rate and regular rhythm.  Pulmonary:     Effort: Pulmonary effort is normal.     Breath sounds: Normal breath sounds.  Skin:    General: Skin is warm and dry.  Neurological:     Mental Status: She is alert.  Psychiatric:        Mood and Affect: Mood normal.      Results for orders placed or performed in visit on 09/12/23  POCT HgB A1C  Result Value Ref Range    Hemoglobin A1C 5.6 4.0 - 5.6 %   HbA1c POC (<> result, manual entry)     HbA1c, POC (prediabetic range)     HbA1c, POC (controlled diabetic range)        The 10-year ASCVD risk score (Arnett DK, et al., 2019) is: 0.4%    Assessment & Plan:   Problem List Items Addressed This Visit       Cardiovascular and Mediastinum   Essential hypertension - Primary   BP ok will monitor        Endocrine   POLYCYSTIC OVARIAN DISEASE   Not currently on medication. Will monitor A1c regularly.      IFG (impaired fasting glucose)   A1C looks fantastic today at 5.6.  Continue current regimen.      Relevant Orders   POCT HgB A1C (Completed)    Mood overall improved.  PHQ 2 score of 0.  And GAD-7 score of 3.  Will call her gyn for her last mammogram. Had it done earlier this months.    Return in about 7 months (around 04/11/2024) for IFG.    Nani Gasser, MD

## 2023-09-12 NOTE — Telephone Encounter (Signed)
Called and left a voice mail message requesting a return call.  Forgot to give covid vaccine today  Will offer to schld as nurse visit to have this done.

## 2023-09-12 NOTE — Assessment & Plan Note (Signed)
A1C looks fantastic today at 5.6.  Continue current regimen.

## 2023-09-12 NOTE — Assessment & Plan Note (Signed)
BP ok - will monitor.

## 2023-09-12 NOTE — Telephone Encounter (Signed)
Left a detailed voice mail message on pt home # ( allowed on DPR )  Requesting a return call to schld a nurse visit for  covid vaccine

## 2024-04-11 ENCOUNTER — Ambulatory Visit: Payer: Managed Care, Other (non HMO) | Admitting: Family Medicine

## 2024-04-23 ENCOUNTER — Encounter: Payer: Self-pay | Admitting: Sports Medicine

## 2024-05-15 ENCOUNTER — Ambulatory Visit: Admitting: Family Medicine

## 2024-06-21 ENCOUNTER — Ambulatory Visit: Payer: Self-pay | Admitting: Family Medicine

## 2024-06-24 ENCOUNTER — Ambulatory Visit: Payer: PRIVATE HEALTH INSURANCE | Admitting: Family Medicine

## 2024-06-24 VITALS — BP 134/78 | HR 88 | Ht 64.0 in | Wt 245.0 lb

## 2024-06-24 DIAGNOSIS — G4700 Insomnia, unspecified: Secondary | ICD-10-CM | POA: Insufficient documentation

## 2024-06-24 DIAGNOSIS — K219 Gastro-esophageal reflux disease without esophagitis: Secondary | ICD-10-CM

## 2024-06-24 DIAGNOSIS — I10 Essential (primary) hypertension: Secondary | ICD-10-CM

## 2024-06-24 DIAGNOSIS — R1319 Other dysphagia: Secondary | ICD-10-CM

## 2024-06-24 DIAGNOSIS — R7301 Impaired fasting glucose: Secondary | ICD-10-CM | POA: Diagnosis not present

## 2024-06-24 DIAGNOSIS — Z1231 Encounter for screening mammogram for malignant neoplasm of breast: Secondary | ICD-10-CM

## 2024-06-24 DIAGNOSIS — F320 Major depressive disorder, single episode, mild: Secondary | ICD-10-CM

## 2024-06-24 LAB — POCT GLYCOSYLATED HEMOGLOBIN (HGB A1C): Hemoglobin A1C: 5.5 % (ref 4.0–5.6)

## 2024-06-24 NOTE — Progress Notes (Signed)
 Established Patient Office Visit  Patient ID: Katherine [name], female    DOB: 04/04/1981  Age: 43 y.o. MRN: [id number] PCP: Alvan Dorothyann BIRCH, MD  Chief Complaint  Patient presents with   ifg    Subjective:     HPI  Discussed the use of AI scribe software for clinical note transcription with the patient, who gave verbal consent to proceed.  History of Present Illness Katherine [name] is a 43 year old female who presents with sleep disturbances and difficulty swallowing.  Sleep disturbance - Difficulty achieving adequate rest despite use of melatonin and magnesium (250 mg) without significant relief - Benadryl  produces a stimulating effect, complicating its use as a sleep aid - Recently used Valerian root as an alternative natural remedy - Currently experiencing increased stress - No regular caffeine use; consumes caffeine approximately once a month  Dysphagia - Difficulty swallowing, particularly with large chunks of chicken and meat - Requires water intake during meals to facilitate swallowing - No associated chest pain or shortness of breath - No prior evaluation by a gastroenterologist  Mood  - this time of year is hard for her, esp around the Holidays.   - Has an appointment with her therapist later today  Flowsheet Row Office Visit from 06/24/2024 in [facility] at [facility]  PHQ-9 Total Score 11      06/24/2024    9:07 AM 09/12/2023    2:38 PM 12/26/2022    3:32 PM 12/13/2021    9:53 AM  GAD 7 : Generalized Anxiety Score  Nervous, Anxious, on Edge 0 0 1 0  Control/stop worrying 2 1 1 2   Worry too much - different things 2 1 2 2   Trouble relaxing 0  1 2  Restless 0 0 0 0  Easily annoyed or irritable 2 1 1 1   Afraid - awful might happen 0 0 0 0  Total GAD 7 Score 6  6 7   Anxiety Difficulty Somewhat difficult Not difficult at all Not difficult at all Somewhat difficult        ROS    Objective:      BP 134/78   Pulse 88   Ht 5' 4 (1.626 m)   Wt 245 lb (111.1 kg)   SpO2 100%   BMI 42.05 kg/m    Physical Exam Vitals and nursing note reviewed.  Constitutional:      Appearance: Normal appearance.  HENT:     Head: Normocephalic and atraumatic.  Eyes:     Conjunctiva/sclera: Conjunctivae normal.  Cardiovascular:     Rate and Rhythm: Normal rate and regular rhythm.  Pulmonary:     Effort: Pulmonary effort is normal.     Breath sounds: Normal breath sounds.  Skin:    General: Skin is warm and dry.  Neurological:     Mental Status: She is alert.  Psychiatric:        Mood and Affect: Mood normal.      Results for orders placed or performed in visit on 06/24/24  POCT HgB A1C  Result Value Ref Range   Hemoglobin A1C 5.5 4.0 - 5.6 %   HbA1c POC (<> result, manual entry)     HbA1c, POC (prediabetic range)     HbA1c, POC (controlled diabetic range)        The 10-year ASCVD risk score (Arnett DK, et al., 2019) is: 0.6%    Assessment & Plan:   Problem List Items Addressed This  Visit       Cardiovascular and Mediastinum   Essential hypertension   BP fair today, will continue to monitor.        Relevant Orders   POCT HgB A1C (Completed)   CMP14+EGFR   Lipid panel   CBC     Digestive   Gastroesophageal reflux disease without esophagitis   Having some dysphagia, will refer to GI for further workup for possible endoscopy.         Endocrine   IFG (impaired fasting glucose) - Primary   Looks great today!!!!       Relevant Orders   POCT HgB A1C (Completed)   CMP14+EGFR   Lipid panel   CBC     Other   Insomnia   Insomnia Difficulty sleeping, possibly due to anxiety. Prefers to avoid prescription sleep aids. - Continue melatonin and magnesium. - Consider Valerian root as a natural sleep aid. - Monitor sleep patterns and notify if prescription aid is needed.      Depression, major, single episode, mild   Reach out in next couple of months if  needs to restart Lexapro  or trazodone for sleep.        Other Visit Diagnoses       Screening mammogram for breast cancer         Other dysphagia       Relevant Orders   CMP14+EGFR   Lipid panel   CBC   Ambulatory referral to Gastroenterology       Assessment and Plan Assessment & Plan Depression and Anxiety Increased stress with sleep disturbances affecting mood. Prefers therapy over medication. - Continue therapy sessions. - Consider short-term medication if symptoms worsen, notify via MyChart if needed.  Dysphagia, possible esophageal stricture Difficulty swallowing, especially large pieces of meat, suggesting possible esophageal stricture. - Refer to GI for evaluation of possible esophageal stricture. - Advise to chew food thoroughly and drink water during meals.  General Health Maintenance Mammogram scheduling discussed. Flu shot received. - Schedule mammogram in February. - Continue annual flu vaccinations.   Return in about 6 months (around 12/22/2024) for Pre-diabetes.    Dorothyann Byars, MD [facility] at [facility]

## 2024-06-24 NOTE — Assessment & Plan Note (Signed)
 Reach out in next couple of months if needs to restart Lexapro  or trazodone for sleep.

## 2024-06-24 NOTE — Assessment & Plan Note (Signed)
 Having some dysphagia, will refer to GI for further workup for possible endoscopy.

## 2024-06-24 NOTE — Assessment & Plan Note (Signed)
 BP fair today, will continue to monitor.

## 2024-06-24 NOTE — Assessment & Plan Note (Signed)
 Insomnia Difficulty sleeping, possibly due to anxiety. Prefers to avoid prescription sleep aids. - Continue melatonin and magnesium. - Consider Valerian root as a natural sleep aid. - Monitor sleep patterns and notify if prescription aid is needed.

## 2024-06-24 NOTE — Assessment & Plan Note (Signed)
 Looks great today

## 2024-06-25 ENCOUNTER — Ambulatory Visit: Payer: Self-pay | Admitting: Family Medicine

## 2024-06-25 LAB — CMP14+EGFR
ALT: 20 IU/L (ref 0–32)
AST: 22 IU/L (ref 0–40)
Albumin: 4.2 g/dL (ref 3.9–4.9)
Alkaline Phosphatase: 74 IU/L (ref 41–116)
BUN/Creatinine Ratio: 12 (ref 9–23)
BUN: 11 mg/dL (ref 6–24)
Bilirubin Total: 0.2 mg/dL (ref 0.0–1.2)
CO2: 21 mmol/L (ref 20–29)
Calcium: 9.4 mg/dL (ref 8.7–10.2)
Chloride: 104 mmol/L (ref 96–106)
Creatinine, Ser: 0.95 mg/dL (ref 0.57–1.00)
Globulin, Total: 3.1 g/dL (ref 1.5–4.5)
Glucose: 81 mg/dL (ref 70–99)
Potassium: 4.1 mmol/L (ref 3.5–5.2)
Sodium: 139 mmol/L (ref 134–144)
Total Protein: 7.3 g/dL (ref 6.0–8.5)
eGFR: 77 mL/min/1.73 (ref >59)

## 2024-06-25 LAB — CBC
Hematocrit: 36.5 % (ref 34.0–46.6)
Hemoglobin: 11.1 g/dL (ref 11.1–15.9)
MCH: 24.9 pg — ABNORMAL LOW (ref 26.6–33.0)
MCHC: 30.4 g/dL — ABNORMAL LOW (ref 31.5–35.7)
MCV: 82 fL (ref 79–97)
Platelets: 346 x10E3/uL (ref 150–450)
RBC: 4.46 x10E6/uL (ref 3.77–5.28)
RDW: 15.9 % — ABNORMAL HIGH (ref 11.7–15.4)
WBC: 6 x10E3/uL (ref 3.4–10.8)

## 2024-06-25 LAB — LIPID PANEL
Chol/HDL Ratio: 2.6 ratio (ref 0.0–4.4)
Cholesterol, Total: 203 mg/dL — ABNORMAL HIGH (ref 100–199)
HDL: 79 mg/dL (ref >39)
LDL Chol Calc (NIH): 112 mg/dL — ABNORMAL HIGH (ref 0–99)
Triglycerides: 68 mg/dL (ref 0–149)
VLDL Cholesterol Cal: 12 mg/dL (ref 5–40)

## 2024-06-25 NOTE — Progress Notes (Signed)
 HI Katherine [name], your total cholesterol and LDL are up a little. Continue to work on [employer] and exercise.  I noticed you had lost weight when I saw you yesterday, that is awesome!!!metabolic panel is OK.   We will call labd and add an Iron panel

## 2024-06-27 LAB — FERRITIN: Ferritin: 12 ng/mL — AB (ref 15–150)

## 2024-06-27 LAB — SPECIMEN STATUS REPORT

## 2024-06-27 LAB — IRON AND TIBC
Iron Saturation: 8 % — AB (ref 15–55)
Iron: 32 ug/dL (ref 27–159)
Total Iron Binding Capacity: 402 ug/dL (ref 250–450)
UIBC: 370 ug/dL (ref 131–425)

## 2024-07-01 NOTE — Progress Notes (Signed)
 Hi Katherine [name], total iron and iron saturation are low.  Ferritin level still low as well.  I think you said you were taking some iron.  Do think you would be able to increase to twice a day?

## 2024-07-04 NOTE — Progress Notes (Signed)
 Honestly I would say to increase to twice a day and maybe take a stool softener with it

## 2024-07-08 ENCOUNTER — Telehealth: Payer: Self-pay

## 2024-07-08 NOTE — Telephone Encounter (Signed)
 Copied from CRM #[id number]. Topic: Clinical - Lab/Test Results >> Jul 05, 2024  4:13 PM Delon HERO wrote: Reason for RMF:Ejupzwu called back relayed message about iron and stool softer. Patient expressed understanding. No questions at this time.

## 2024-07-09 ENCOUNTER — Encounter: Payer: Self-pay | Admitting: Family Medicine

## 2024-12-23 ENCOUNTER — Ambulatory Visit: Payer: PRIVATE HEALTH INSURANCE | Admitting: Family Medicine
